# Patient Record
Sex: Female | Born: 1960 | Race: White | Hispanic: No | Marital: Married | Smoking: Current every day smoker
Health system: Southern US, Community
[De-identification: ages and names within clinical notes are randomized; demographics above are authoritative.]

## PROBLEM LIST (undated history)

## (undated) DIAGNOSIS — E785 Hyperlipidemia, unspecified: Secondary | ICD-10-CM

## (undated) DIAGNOSIS — R011 Cardiac murmur, unspecified: Secondary | ICD-10-CM

## (undated) DIAGNOSIS — I10 Essential (primary) hypertension: Secondary | ICD-10-CM

## (undated) HISTORY — DX: Cardiac murmur, unspecified: R01.1

## (undated) HISTORY — DX: Hyperlipidemia, unspecified: E78.5

## (undated) HISTORY — PX: TUBAL LIGATION: SHX77

## (undated) HISTORY — DX: Essential (primary) hypertension: I10

---

## 1997-12-25 ENCOUNTER — Ambulatory Visit (HOSPITAL_COMMUNITY): Admission: RE | Admit: 1997-12-25 | Discharge: 1997-12-25 | Payer: Self-pay | Admitting: Obstetrics and Gynecology

## 1998-09-06 ENCOUNTER — Other Ambulatory Visit: Admission: RE | Admit: 1998-09-06 | Discharge: 1998-09-06 | Payer: Self-pay | Admitting: *Deleted

## 1999-09-19 ENCOUNTER — Other Ambulatory Visit: Admission: RE | Admit: 1999-09-19 | Discharge: 1999-09-19 | Payer: Self-pay | Admitting: Obstetrics and Gynecology

## 2000-10-23 ENCOUNTER — Other Ambulatory Visit: Admission: RE | Admit: 2000-10-23 | Discharge: 2000-10-23 | Payer: Self-pay | Admitting: Obstetrics and Gynecology

## 2001-11-28 ENCOUNTER — Other Ambulatory Visit: Admission: RE | Admit: 2001-11-28 | Discharge: 2001-11-28 | Payer: Self-pay | Admitting: Obstetrics and Gynecology

## 2003-05-28 ENCOUNTER — Other Ambulatory Visit: Admission: RE | Admit: 2003-05-28 | Discharge: 2003-05-28 | Payer: Self-pay | Admitting: Obstetrics and Gynecology

## 2014-06-19 ENCOUNTER — Telehealth: Payer: Self-pay | Admitting: Neurology

## 2014-06-19 NOTE — Telephone Encounter (Signed)
Called back and documented in patient's mother's chart. This is not our patient.

## 2014-06-19 NOTE — Telephone Encounter (Signed)
Pt states that she needs to talk with you Deanna Woods please call [phone]

## 2014-06-19 NOTE — Telephone Encounter (Signed)
Pt is going to send something by my chart please check it

## 2014-11-06 ENCOUNTER — Encounter (HOSPITAL_COMMUNITY): Payer: Self-pay

## 2014-11-06 ENCOUNTER — Emergency Department (INDEPENDENT_AMBULATORY_CARE_PROVIDER_SITE_OTHER): Payer: Worker's Compensation

## 2014-11-06 ENCOUNTER — Emergency Department (INDEPENDENT_AMBULATORY_CARE_PROVIDER_SITE_OTHER)
Admission: EM | Admit: 2014-11-06 | Discharge: 2014-11-06 | Disposition: A | Discharge status: Home / Self Care | Payer: Worker's Compensation | Source: Home / Self Care | Attending: Emergency Medicine | Admitting: Emergency Medicine

## 2014-11-06 DIAGNOSIS — S6010XA Contusion of unspecified finger with damage to nail, initial encounter: Secondary | ICD-10-CM

## 2014-11-06 DIAGNOSIS — S6000XA Contusion of unspecified finger without damage to nail, initial encounter: Secondary | ICD-10-CM

## 2014-11-06 NOTE — ED Provider Notes (Signed)
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 There is pain and swelling over the distal phalanx. She has blood underneath the nail. It hurts to flex the interphalangeal joint.  Review of Systems   Other than as noted above, the patient denies any of the following symptoms: Systemic:  No fevers or chills. Musculoskeletal:  No joint pain or arthritis.  Neurological:  No muscular weakness or paresthesias.  March ARB   Past medical history, family history, social history, meds, and allergies were reviewed.     Physical Examination   Vital signs:  BP 154/88 mmHg  Pulse 90  Temp(Src) 98.7 F (37.1 C) (Oral)  Resp 18  SpO2 98% Gen:  Alert and in no distress. Musculoskeletal:  Exam of the hand reveals a subungual hematoma, and tenderness to palpation with swelling over the distal phalanx. The interphalangeal joint has a full range of motion but with pain.  Otherwise, all joints had a full a ROM with no swelling, bruising or deformity.  No edema, pulses full. Extremities were warm and pink.  Capillary refill was brisk.  Skin:  Clear, warm and dry.  No rash. Neuro:  Alert and oriented.  Muscle strength was normal.  Sensation was intact to light touch.   Radiology   Dg Finger Thumb Left  11/06/2014   CLINICAL DATA:  Slammed vehicle door on left thumb at work yesterday  EXAM: LEFT THUMB 2+V  COMPARISON:  None.  FINDINGS: Negative for fracture, dislocation or radiopaque foreign body. 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  IMPRESSION: Negative for acute fracture   Electronically Signed   By: Andreas Newport M.D.   On: 11/06/2014 18:38    I reviewed the images independently and personally and concur with the  radiologist's findings.  Procedure Note:  Verbal informed consent was obtained from the patient.  Risks and benefits were outlined with the patient.  Patient understands and accepts these risks. A time out was called and the name of the procedure, the procedure site, and identity of the patient were confirmed verbally and by wristband.    The procedure was then performed as follows:  The nail was prepped with alcohol, then 2 holes were made into the area of the subungual hematoma and large amount of blood was drained out. The patient experienced relief of pain thereafter. The thumb was bandaged.  The patient tolerated the procedure well without any immediate complications.    Assessment   The encounter diagnosis was Subungual hematoma of digit of hand, initial encounter.  Plan  1.  Meds:  The following meds were prescribed:  There are no discharge medications for this patient.   2.  Patient Education/Counseling:  The patient was given appropriate handouts, self care instructions, and instructed in symptomatic relief, including rest and activity, and elevation. She was instructed in wound care.  3.  Follow up:  The patient was told to follow up here if no better in 3 to 4 days, or sooner if becoming worse in any way, and given some red flag symptoms such as worsening pain, fever, swelling, or neurological symptoms which would prompt immediate return.  Return again as needed.      Harden Mo, MD 11/06/14  2047 

## 2014-11-06 NOTE — Discharge Instructions (Signed)
Wash with soap and water and apply antibiotic ointment.  Keep covered for 3 to 4 days.  Continue antibiotic ointment for 1 week.

## 2014-11-06 NOTE — ED Notes (Signed)
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 Distal segment swollen, nail bed ecchymotic. No new break in skin

## 2015-09-06 IMAGING — DX DG FINGER THUMB 2+V*L*
3 series · 3 of 3 positions shown · non-contrast
Comparison: None.

CLINICAL DATA: Slammed vehicle door on left thumb at work yesterday

EXAM:
LEFT THUMB 2+V

[finger ap]
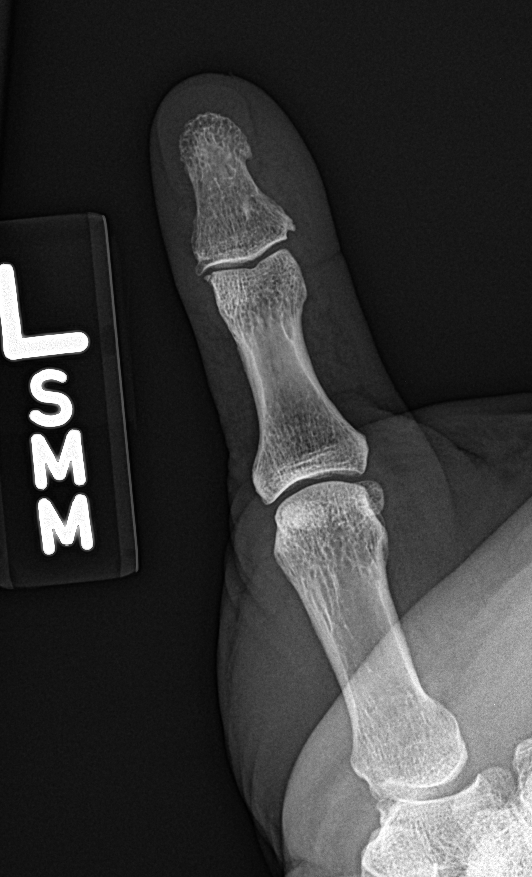

[finger obl]
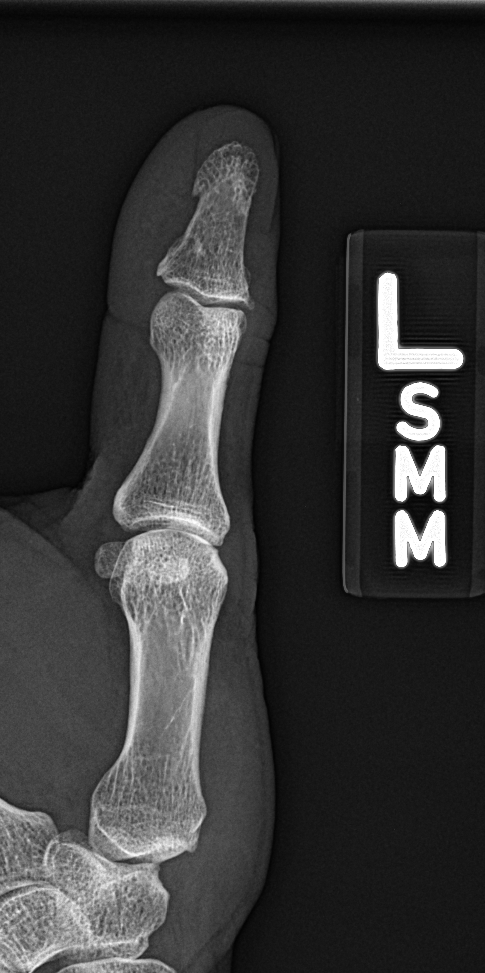

[finger lat]
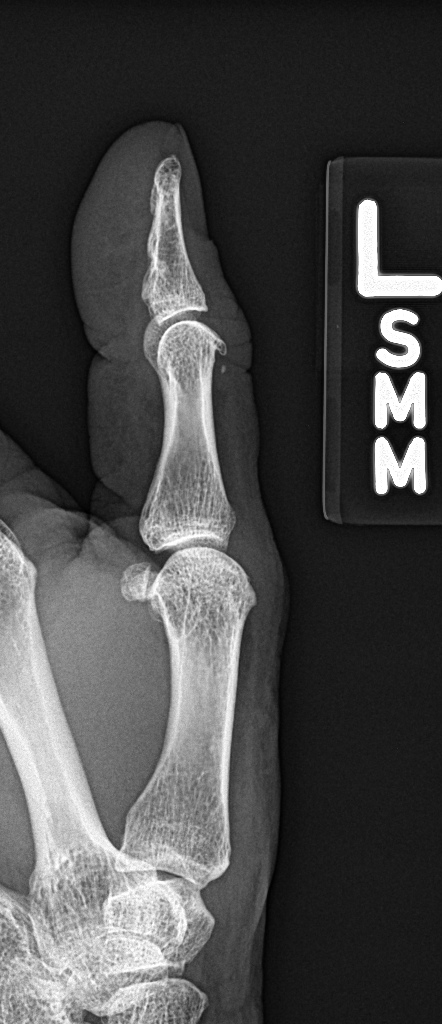

[3 of 3 positions shown; findings below may reference images not displayed]

FINDINGS: Negative for fracture, dislocation or radiopaque foreign body. There
is mild old fragmentation at the radial aspect of the
interphalangeal joint, and there are mild osteoarthritic changes of
the interphalangeal joint.
IMPRESSION: Negative for acute fracture

## 2015-10-26 ENCOUNTER — Encounter: Payer: Self-pay | Admitting: Internal Medicine

## 2015-10-26 ENCOUNTER — Encounter (INDEPENDENT_AMBULATORY_CARE_PROVIDER_SITE_OTHER): Payer: Self-pay

## 2015-10-26 ENCOUNTER — Ambulatory Visit (INDEPENDENT_AMBULATORY_CARE_PROVIDER_SITE_OTHER): Payer: BC Managed Care – PPO | Admitting: Internal Medicine

## 2015-10-26 VITALS — BP 156/88 | HR 86 | Temp 98.3°F | Resp 16 | Ht 67.0 in | Wt 182.0 lb

## 2015-10-26 DIAGNOSIS — Z23 Encounter for immunization: Secondary | ICD-10-CM | POA: Diagnosis not present

## 2015-10-26 DIAGNOSIS — Z0000 Encounter for general adult medical examination without abnormal findings: Secondary | ICD-10-CM

## 2015-10-26 DIAGNOSIS — F17210 Nicotine dependence, cigarettes, uncomplicated: Secondary | ICD-10-CM | POA: Insufficient documentation

## 2015-10-26 DIAGNOSIS — R030 Elevated blood-pressure reading, without diagnosis of hypertension: Secondary | ICD-10-CM

## 2015-10-26 DIAGNOSIS — Z720 Tobacco use: Secondary | ICD-10-CM

## 2015-10-26 DIAGNOSIS — IMO0001 Reserved for inherently not codable concepts without codable children: Secondary | ICD-10-CM | POA: Insufficient documentation

## 2015-10-26 NOTE — Assessment & Plan Note (Signed)
Blood pressure elevated here today and may have hypertension, but we need to recheck for officially diagnosing her with hypertension Advised her to monitor her own Low-sodium diet Start exercising regularly Smoking cessation discussed Check blood work Follow-up in a few weeks for nurse visit to recheck blood pressure

## 2015-10-26 NOTE — Patient Instructions (Addendum)
[facility] [address] [place], [place] [address] the [address] from [place]  [place], [place]    Test(s) ordered today. Your results will be released to [place] (or called to you) after review, usually within 72hours after test completion. If any changes need to be made, you will be notified at that same time.  All other Health Maintenance issues reviewed.   All recommended immunizations and age-appropriate screenings are up-to-date.  Tetanus vaccine administered today.   Medications reviewed and updated.  No changes recommended at this time.  Please call and schedule a mammogram and bone density scan or discuss with your new gyn.   Please schedule followup in 2-3 weeks for BP recheck with nurse   Health Maintenance, Female Adopting a healthy lifestyle and getting preventive care can go a long way to promote health and wellness. Talk with your health care provider about what schedule of regular examinations is right for you. This is a good chance for you to check in with your provider about disease prevention and staying healthy. In between checkups, there are plenty of things you can do on your own. Experts have done a lot of research about which lifestyle changes and preventive measures are most likely to keep you healthy. Ask your health care provider for more information. WEIGHT AND DIET  Eat a healthy diet  Be sure to include plenty of vegetables, fruits, low-fat dairy products, and lean protein.  Do not eat a lot of foods high in solid fats, added sugars, or salt.  Get regular exercise. This is one of the most important things you can do for your health.  Most adults should exercise for at least 150 minutes each week. The exercise should increase your heart rate and make you sweat  (moderate-intensity exercise).  Most adults should also do strengthening exercises at least twice a week. This is in addition to the moderate-intensity exercise.  Maintain a healthy weight  Body mass index (BMI) is a measurement that can be used to identify possible weight problems. It estimates body fat based on height and weight. Your health care provider can help determine your BMI and help you achieve or maintain a healthy weight.  For females 48 years of age and older:   A BMI below 18.5 is considered underweight.  A BMI of 18.5 to 24.9 is normal.  A BMI of 25 to 29.9 is considered overweight.  A BMI of 30 and above is considered obese.  Watch levels of cholesterol and blood lipids  You should start having your blood tested for lipids and cholesterol at 54 years of age, then have this test every 5 years.  You may need to have your cholesterol levels checked more often if:  Your lipid or cholesterol levels are high.  You are older than 54 years of age.  You are at high risk for heart disease.  CANCER SCREENING   Lung Cancer  Lung cancer screening is recommended for adults 57-78 years old who are at high risk for lung cancer because of a history of smoking.  A yearly low-dose CT scan of the lungs is recommended for people who:  Currently smoke.  Have quit within the past 15 years.  Have at least a 30-pack-year history of smoking. A pack year is smoking an average of one pack of cigarettes a day for 1 year.  Yearly screening should continue until it has been 15 years since you quit.  Yearly screening should stop if you develop a health problem that would prevent you from having lung cancer treatment.  Breast Cancer  Practice breast self-awareness. This means understanding how your breasts normally appear and feel.  It also means doing regular breast self-exams. Let your health care provider know about any changes, no matter how small.  If you are in your 20s  or 30s, you should have a clinical breast exam (CBE) by a health care provider every 1-3 years as part of a regular health exam.  If you are 68 or older, have a CBE every year. Also consider having a breast X-ray (mammogram) every year.  If you have a family history of breast cancer, talk to your health care provider about genetic screening.  If you are at high risk for breast cancer, talk to your health care provider about having an MRI and a mammogram every year.  Breast cancer gene (BRCA) assessment is recommended for women who have family members with BRCA-related cancers. BRCA-related cancers include:  Breast.  Ovarian.  Tubal.  Peritoneal cancers.  Results of the assessment will determine the need for genetic counseling and BRCA1 and BRCA2 testing. Cervical Cancer Your health care provider may recommend that you be screened regularly for cancer of the pelvic organs (ovaries, uterus, and vagina). This screening involves a pelvic examination, including checking for microscopic changes to the surface of your cervix (Pap test). You may be encouraged to have this screening done every 3 years, beginning at age 37.  For women ages 10-65, health care providers may recommend pelvic exams and Pap testing every 3 years, or they may recommend the Pap and pelvic exam, combined with testing for human papilloma virus (HPV), every 5 years. Some types of HPV increase your risk of cervical cancer. Testing for HPV may also be done on women of any age with unclear Pap test results.  Other health care providers may not recommend any screening for nonpregnant women who are considered low risk for pelvic cancer and who do not have symptoms. Ask your health care provider if a screening pelvic exam is right for you.  If you have had past treatment for cervical cancer or a condition that could lead to cancer, you need Pap tests and screening for cancer for at least 20 years after your treatment. If Pap tests  have been discontinued, your risk factors (such as having a new sexual partner) need to be reassessed to determine if screening should resume. Some women have medical problems that increase the chance of getting cervical cancer. In these cases, your health care provider may recommend more frequent screening and Pap tests. Colorectal Cancer  This type of cancer can be detected and often prevented.  Routine colorectal cancer screening usually begins at 54 years of age and continues through 54 years of age.  Your health care provider may recommend screening at an earlier age if you have risk factors for colon cancer.  Your health care provider may also recommend using home test kits to check for hidden blood in the stool.  A small camera at the end of a tube can be used to examine your colon directly (sigmoidoscopy or colonoscopy). This is done to check for the earliest forms of colorectal cancer.  Routine screening usually begins at age 51.  Direct examination of the colon should be repeated every 5-10 years through 54 years of age. However, you  may need to be screened more often if early forms of precancerous polyps or small growths are found. Skin Cancer  Check your skin from head to toe regularly.  Tell your health care provider about any new moles or changes in moles, especially if there is a change in a mole's shape or color.  Also tell your health care provider if you have a mole that is larger than the size of a pencil eraser.  Always use sunscreen. Apply sunscreen liberally and repeatedly throughout the day.  Protect yourself by wearing long sleeves, pants, a wide-brimmed hat, and sunglasses whenever you are outside. HEART DISEASE, DIABETES, AND HIGH BLOOD PRESSURE   High blood pressure causes heart disease and increases the risk of stroke. High blood pressure is more likely to develop in:  People who have blood pressure in the high end of the normal range (130-139/85-89 mm  Hg).  People who are overweight or obese.  People who are African American.  If you are 25-25 years of age, have your blood pressure checked every 3-5 years. If you are 49 years of age or older, have your blood pressure checked every year. You should have your blood pressure measured twice--once when you are at a hospital or clinic, and once when you are not at a hospital or clinic. Record the average of the two measurements. To check your blood pressure when you are not at a hospital or clinic, you can use:  An automated blood pressure machine at a pharmacy.  A home blood pressure monitor.  If you are between 76 years and 77 years old, ask your health care provider if you should take aspirin to prevent strokes.  Have regular diabetes screenings. This involves taking a blood sample to check your fasting blood sugar level.  If you are at a normal weight and have a low risk for diabetes, have this test once every three years after 54 years of age.  If you are overweight and have a high risk for diabetes, consider being tested at a younger age or more often. PREVENTING INFECTION  Hepatitis B  If you have a higher risk for hepatitis B, you should be screened for this virus. You are considered at high risk for hepatitis B if:  You were born in a country where hepatitis B is common. Ask your health care provider which countries are considered high risk.  Your parents were born in a high-risk country, and you have not been immunized against hepatitis B (hepatitis B vaccine).  You have HIV or AIDS.  You use needles to inject street drugs.  You live with someone who has hepatitis B.  You have had sex with someone who has hepatitis B.  You get hemodialysis treatment.  You take certain medicines for conditions, including cancer, organ transplantation, and autoimmune conditions. Hepatitis C  Blood testing is recommended for:  Everyone born from [birth year] through 1964.  Anyone with known  risk factors for hepatitis C. Sexually transmitted infections (STIs)  You should be screened for sexually transmitted infections (STIs) including gonorrhea and chlamydia if:  You are sexually active and are younger than 54 years of age.  You are older than 54 years of age and your health care provider tells you that you are at risk for this type of infection.  Your sexual activity has changed since you were last screened and you are at an increased risk for chlamydia or gonorrhea. Ask your health care provider if you are at risk.  If you do not have HIV, but are at risk, it may be recommended that you take a prescription medicine daily to prevent HIV infection. This is called pre-exposure prophylaxis (PrEP). You are considered at risk if:  You are sexually active and do not regularly use condoms or know the HIV status of your partner(s).  You take drugs by injection.  You are sexually active with a partner who has HIV. Talk with your health care provider about whether you are at high risk of being infected with HIV. If you choose to begin PrEP, you should first be tested for HIV. You should then be tested every 3 months for as long as you are taking PrEP.  PREGNANCY   If you are premenopausal and you may become pregnant, ask your health care provider about preconception counseling.  If you may become pregnant, take 400 to 800 micrograms (mcg) of folic acid every day.  If you want to prevent pregnancy, talk to your health care provider about birth control (contraception). OSTEOPOROSIS AND MENOPAUSE   Osteoporosis is a disease in which the bones lose minerals and strength with aging. This can result in serious bone fractures. Your risk for osteoporosis can be identified using a bone density scan.  If you are 25 years of age or older, or if you are at risk for osteoporosis and fractures, ask your health care provider if you should be screened.  Ask your health care provider whether you  should take a calcium or vitamin D supplement to lower your risk for osteoporosis.  Menopause may have certain physical symptoms and risks.  Hormone replacement therapy may reduce some of these symptoms and risks. Talk to your health care provider about whether hormone replacement therapy is right for you.  HOME CARE INSTRUCTIONS   Schedule regular health, dental, and eye exams.  Stay current with your immunizations.   Do not use any tobacco products including cigarettes, chewing tobacco, or electronic cigarettes.  If you are pregnant, do not drink alcohol.  If you are breastfeeding, limit how much and how often you drink alcohol.  Limit alcohol intake to no more than 1 drink per day for nonpregnant women. One drink equals 12 ounces of beer, 5 ounces of wine, or 1 ounces of hard liquor.  Do not use street drugs.  Do not share needles.  Ask your health care provider for help if you need support or information about quitting drugs.  Tell your health care provider if you often feel depressed.  Tell your health care provider if you have ever been abused or do not feel safe at home.   This information is not intended to replace advice given to you by your health care provider. Make sure you discuss any questions you have with your health care provider.   Document Released: 04/10/2011 Document Revised: 10/16/2014 Document Reviewed: 08/27/2013 Elsevier Interactive Patient Education [employer].

## 2015-10-26 NOTE — Progress Notes (Signed)
Pre visit review using our clinic review tool, if applicable. No additional management support is needed unless otherwise documented below in the visit note. 

## 2015-10-26 NOTE — Assessment & Plan Note (Signed)
Stressed smoking cessation Needs to wait until her husband is ready to quit We will discuss again at her next visit

## 2015-10-26 NOTE — Progress Notes (Signed)
Subjective:    Patient ID: Deanna Woods, female    DOB: [birth year], 55 y.o.   MRN: [id number]  HPI She is here to establish with a new pcp.  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    Her BP is elevated here today. The last time was checked was a few years ago. She denies a known history of high blood pressure.  She is smoking daily. She knows she should quit and wants to quit, but states she cannot quit her husband is not quite. She is working on trying to convince him the need to quit.  She is currently not exercising regularly. Her goal today is to start to get up-to-date with everything she has not been keeping up to date with. She has no major concerns or questions.    Medications and allergies reviewed with patient and no updates were needed-she has not taking any medication or supplements.  Patient Active Problem List   Diagnosis Date Noted  . Elevated blood pressure 10/26/2015  . Tobacco abuse 10/26/2015    No current outpatient prescriptions on file prior to visit.   No current facility-administered medications on file prior to visit.    Past Medical History  Diagnosis Date  . Heart murmur     Past Surgical History  Procedure Laterality Date  . Tubal ligation      Social History   Social History  . Marital Status: Married    Spouse Name: N/A  . Number of Children: N/A  . Years of Education: N/A   Social History Main Topics  . Smoking status: Current Every Day Smoker -- 0.50 packs/day for 25 years  . Smokeless tobacco: Never Used  . Alcohol Use: Yes     Comment: weekends  . Drug Use: No  . Sexual Activity: Not Asked   Other Topics Concern  . None   Social History Narrative   Very active, not regular exercise    Family History  Problem Relation Age of Onset  . Hypertension Mother   . Stroke Mother   . Kidney disease Mother   . Stroke Maternal Grandmother     Review of  Systems  Constitutional: Positive for fatigue. Negative for fever, chills, appetite change and unexpected weight change.       Sleeps 6 hrs  HENT: Negative for hearing loss.   Eyes: Negative for visual disturbance.  Respiratory: Negative for cough, shortness of breath and wheezing.   Cardiovascular: Negative for chest pain and leg swelling. Palpitations: stress related.  Gastrointestinal: Negative for nausea, abdominal pain, diarrhea, constipation and blood in stool.       No GERD  Genitourinary: Negative for dysuria and hematuria.  Musculoskeletal: Negative for myalgias, back pain and arthralgias.       Stiffness  Skin: Negative for rash.       No changes in moles/freckles  Neurological: Positive for headaches (occasional). Negative for dizziness, weakness, light-headedness and numbness.  Psychiatric/Behavioral: Negative for dysphoric mood. The patient is not nervous/anxious.        Objective:   Filed Vitals:   10/26/15 1506  BP: 156/88  Pulse: 86  Temp: 98.3 F (36.8 C)  Resp: 16   Filed Weights   10/26/15 1506  Weight: 182 lb (82.555 kg)   Body mass index is 28.5 kg/(m^2).   Physical Exam Constitutional: She appears well-developed and well-nourished. No distress.  HENT:  Head: Normocephalic and atraumatic.  Right Ear: External ear normal. Normal ear canal and TM Left Ear: External ear normal.  Normal ear canal and TM Mouth/Throat: Oropharynx is clear and moist.  Normal bilateral ear canals and tympanic membranes  Eyes: Conjunctivae and EOM are normal.  Neck: Neck supple. No tracheal deviation present. No thyromegaly present.  No carotid bruit  Cardiovascular: Normal rate, regular rhythm and normal heart sounds.   No murmur heard.  No edema. Pulmonary/Chest: Effort normal and breath sounds normal. No respiratory distress. She has no wheezes. She has no rales.  Breast: deferred to Gyn-we will make an appointment Abdominal: Soft. She exhibits no distension. There is  no tenderness.  Lymphadenopathy: She has no cervical adenopathy.  Skin: Skin is warm and dry. She is not diaphoretic.  Psychiatric: She has a normal mood and affect. Her behavior is normal.         Assessment & Plan:   Physical exam: Screening blood work ordered Immunizations - ? td today, flu - she will get at the pharmacy Colonoscopy - will refer  Mammogram - will schedule Gyn - given numbers - she will schedule Dexa - will schedule or discuss with gyn Eye exams - up to date, normal EKG today  Exercise - stressed regular exercise Weight - stressed regular exercise Skin  - no concerns Substance abuse - smoking - stressed cessation - she is not ready. We'll discuss again at her next visit   See problem list for assessment and plan of chronic medical conditions

## 2015-10-27 NOTE — Addendum Note (Signed)
Addended by: Terence Lux B on: 10/27/2015 08:23 AM   Modules accepted: Orders

## 2015-11-18 ENCOUNTER — Telehealth: Payer: Self-pay | Admitting: Emergency Medicine

## 2015-11-18 ENCOUNTER — Ambulatory Visit: Payer: BC Managed Care – PPO | Admitting: General Practice

## 2015-11-18 ENCOUNTER — Other Ambulatory Visit (INDEPENDENT_AMBULATORY_CARE_PROVIDER_SITE_OTHER): Payer: BC Managed Care – PPO

## 2015-11-18 VITALS — BP 140/90

## 2015-11-18 DIAGNOSIS — Z0000 Encounter for general adult medical examination without abnormal findings: Secondary | ICD-10-CM | POA: Diagnosis not present

## 2015-11-18 DIAGNOSIS — I10 Essential (primary) hypertension: Secondary | ICD-10-CM

## 2015-11-18 LAB — CBC WITH DIFFERENTIAL/PLATELET
BASOS ABS: 0.1 10*3/uL (ref 0.0–0.1)
Basophils Relative: 0.6 % (ref 0.0–3.0)
Eosinophils Absolute: 0.2 10*3/uL (ref 0.0–0.7)
Eosinophils Relative: 1.8 % (ref 0.0–5.0)
HEMATOCRIT: 45.1 % (ref 36.0–46.0)
Hemoglobin: 14.8 g/dL (ref 12.0–15.0)
LYMPHS PCT: 27.4 % (ref 12.0–46.0)
Lymphs Abs: 2.9 10*3/uL (ref 0.7–4.0)
MCHC: 32.7 g/dL (ref 30.0–36.0)
MCV: 90.5 fl (ref 78.0–100.0)
MONOS PCT: 5.6 % (ref 3.0–12.0)
Monocytes Absolute: 0.6 10*3/uL (ref 0.1–1.0)
NEUTROS ABS: 6.8 10*3/uL (ref 1.4–7.7)
NEUTROS PCT: 64.6 % (ref 43.0–77.0)
PLATELETS: 301 10*3/uL (ref 150.0–400.0)
RBC: 4.99 Mil/uL (ref 3.87–5.11)
RDW: 13.6 % (ref 11.5–15.5)
WBC: 10.5 10*3/uL (ref 4.0–10.5)

## 2015-11-18 LAB — COMPREHENSIVE METABOLIC PANEL
ALK PHOS: 73 U/L (ref 39–117)
ALT: 11 U/L (ref 0–35)
AST: 14 U/L (ref 0–37)
Albumin: 4.3 g/dL (ref 3.5–5.2)
BUN: 15 mg/dL (ref 6–23)
CALCIUM: 9.5 mg/dL (ref 8.4–10.5)
CO2: 30 meq/L (ref 19–32)
CREATININE: 0.88 mg/dL (ref 0.40–1.20)
Chloride: 105 mEq/L (ref 96–112)
GFR: 71.4 mL/min (ref >60.00)
GLUCOSE: 89 mg/dL (ref 70–99)
Potassium: 4 mEq/L (ref 3.5–5.1)
Sodium: 140 mEq/L (ref 135–145)
Total Bilirubin: 0.4 mg/dL (ref 0.2–1.2)
Total Protein: 7.1 g/dL (ref 6.0–8.3)

## 2015-11-18 LAB — LIPID PANEL
CHOL/HDL RATIO: 4
Cholesterol: 225 mg/dL — ABNORMAL HIGH (ref 0–200)
HDL: 60.4 mg/dL (ref >39.00)
LDL CALC: 133 mg/dL — AB (ref 0–99)
NonHDL: 164.25
Triglycerides: 156 mg/dL — ABNORMAL HIGH (ref 0.0–149.0)
VLDL: 31.2 mg/dL (ref 0.0–40.0)

## 2015-11-18 LAB — TSH: TSH: 2.47 u[IU]/mL (ref 0.35–4.50)

## 2015-11-18 LAB — HEMOGLOBIN A1C: Hgb A1c MFr Bld: 5.7 % (ref 4.6–6.5)

## 2015-11-18 LAB — HIV ANTIBODY (ROUTINE TESTING W REFLEX): HIV 1&2 Ab, 4th Generation: NONREACTIVE

## 2015-11-18 MED ORDER — LOSARTAN POTASSIUM 25 MG PO TABS: 25.0000 mg | ORAL_TABLET | Freq: Every day | ORAL | Status: DC

## 2015-11-18 NOTE — Telephone Encounter (Signed)
-----   Message from Binnie Rail, MD sent at 11/18/2015 10:22 AM EST ----- Regarding: FW: B/P re-check BP still high - I would recommend starting a low dose medication - see what she thinks. If start med - I would want to see her for follow up in 2-4 weeks.  Can try amlodipine 5 mg daily.    ----- Message -----    From: Warden Fillers, RN    Sent: 11/18/2015   8:58 AM      To: Binnie Rail, MD Subject: B/P re-check                                   See B/P reading.  Please advise!  Jenny Reichmann

## 2015-11-18 NOTE — Telephone Encounter (Signed)
LVM informing pt

## 2015-11-18 NOTE — Telephone Encounter (Signed)
Losartan 25 mg sent to pof - low dose and well tolerated -f/u with me in a few weeks.

## 2015-11-18 NOTE — Telephone Encounter (Signed)
Spoke with pt. She is okay with starting a lose dose med, but would like it to be something other than Amlodipine. She says that her mother did not have a good experience with this med. Please advise on another med that pt can try.

## 2015-11-22 LAB — VITAMIN D 1,25 DIHYDROXY
VITAMIN D 1, 25 (OH) TOTAL: 36 pg/mL (ref 18–72)
VITAMIN D3 1, 25 (OH): 36 pg/mL
Vitamin D2 1, 25 (OH)2: <8 pg/mL

## 2015-11-23 ENCOUNTER — Encounter: Payer: Self-pay | Admitting: Family

## 2015-11-27 ENCOUNTER — Encounter: Payer: Self-pay | Admitting: Internal Medicine

## 2016-03-13 ENCOUNTER — Other Ambulatory Visit: Payer: Self-pay | Admitting: Internal Medicine

## 2016-08-08 ENCOUNTER — Telehealth: Payer: Self-pay | Admitting: Internal Medicine

## 2016-08-08 DIAGNOSIS — Z1211 Encounter for screening for malignant neoplasm of colon: Secondary | ICD-10-CM

## 2016-08-08 NOTE — Telephone Encounter (Signed)
Patient is requesting a new referral to get a colonoscopy w/ GI

## 2016-08-09 NOTE — Telephone Encounter (Signed)
Please advise 

## 2016-08-09 NOTE — Telephone Encounter (Signed)
ordered

## 2016-08-09 NOTE — Telephone Encounter (Signed)
Tried calling pt, unable to LVM.

## 2016-08-14 ENCOUNTER — Encounter: Payer: Self-pay | Admitting: Internal Medicine

## 2016-09-08 ENCOUNTER — Other Ambulatory Visit: Payer: Self-pay | Admitting: Internal Medicine

## 2016-10-04 ENCOUNTER — Ambulatory Visit (AMBULATORY_SURGERY_CENTER): Payer: Self-pay | Admitting: *Deleted

## 2016-10-04 VITALS — Ht 67.0 in | Wt 190.0 lb

## 2016-10-04 DIAGNOSIS — Z1211 Encounter for screening for malignant neoplasm of colon: Secondary | ICD-10-CM

## 2016-10-04 MED ORDER — NA SULFATE-K SULFATE-MG SULF 17.5-3.13-1.6 GM/177ML PO SOLN: ORAL | 0 refills | Status: DC

## 2016-10-04 NOTE — Progress Notes (Signed)
Patient denies any allergies to eggs or soy. Patient denies any problems with anesthesia/sedation. Patient denies any oxygen use at home and does not take any diet/weight loss medications. EMMI education assisgned to patient on colonoscopy, this was explained and instructions given to patient. 

## 2016-10-09 HISTORY — PX: COLONOSCOPY: SHX174

## 2016-10-18 ENCOUNTER — Encounter: Payer: Self-pay | Admitting: Internal Medicine

## 2016-10-18 ENCOUNTER — Ambulatory Visit (AMBULATORY_SURGERY_CENTER): Payer: BC Managed Care – PPO | Admitting: Internal Medicine

## 2016-10-18 VITALS — BP 126/70 | HR 70 | Temp 97.5°F | Resp 19 | Ht 67.0 in | Wt 190.0 lb

## 2016-10-18 DIAGNOSIS — K635 Polyp of colon: Secondary | ICD-10-CM | POA: Diagnosis not present

## 2016-10-18 DIAGNOSIS — Z1211 Encounter for screening for malignant neoplasm of colon: Secondary | ICD-10-CM | POA: Diagnosis present

## 2016-10-18 DIAGNOSIS — Z1212 Encounter for screening for malignant neoplasm of rectum: Secondary | ICD-10-CM

## 2016-10-18 DIAGNOSIS — D122 Benign neoplasm of ascending colon: Secondary | ICD-10-CM

## 2016-10-18 MED ORDER — SODIUM CHLORIDE 0.9 % IV SOLN: 500.0000 mL | INTRAVENOUS | Status: DC

## 2016-10-18 NOTE — Progress Notes (Signed)
Spontaneous respirations throughout. VSS. Resting comfortably. To PACU on room air. Report to  Jane RN. 

## 2016-10-18 NOTE — Progress Notes (Signed)
Called to room to assist during endoscopic procedure.  Patient ID and intended procedure confirmed with present staff. Received instructions for my participation in the procedure from the performing physician.  

## 2016-10-18 NOTE — Op Note (Signed)
Deanna Woods: Deanna Woods Procedure Date: 10/18/2016 11:01 AM MRN: [id number] Endoscopist: Docia Chuck. Henrene Pastor , MD Age: 56 Referring MD:  Date of Birth: 27-Aug-1961 Gender: Female Account #: [id number] Procedure:                Colonoscopy, with cold snare polypectomy X 2 Indications:              Screening for colorectal malignant neoplasm Medicines:                Monitored Anesthesia Care Procedure:                Pre-Anesthesia Assessment:                           - Prior to the procedure, a History and Physical                            was performed, and patient medications and                            allergies were reviewed. The patient's tolerance of                            previous anesthesia was also reviewed. The risks                            and benefits of the procedure and the sedation                            options and risks were discussed with the patient.                            All questions were answered, and informed consent                            was obtained. Prior Anticoagulants: The patient has                            taken no previous anticoagulant or antiplatelet                            agents. ASA Grade Assessment: II - A patient with                            mild systemic disease. After reviewing the risks                            and benefits, the patient was deemed in                            satisfactory condition to undergo the procedure.                           After obtaining informed consent, the colonoscope  was passed under direct vision. Throughout the                            procedure, the patient's blood pressure, pulse, and                            oxygen saturations were monitored continuously. The                            Colonoscope was introduced through the anus and                            advanced to the the cecum, identified by                             appendiceal orifice and ileocecal valve. The                            ileocecal valve, appendiceal orifice, and rectum                            were photographed. The quality of the bowel                            preparation was excellent. The colonoscopy was                            performed without difficulty. The patient tolerated                            the procedure well. The bowel preparation used was                            SUPREP. Scope In: 11:08:21 AM Scope Out: 11:23:05 AM Scope Withdrawal Time: 0 hours 11 minutes 29 seconds  Total Procedure Duration: 0 hours 14 minutes 44 seconds  Findings:                 Two sessile polyps were found in the ascending                            colon. The polyps were 5 to 6 mm in size. These                            polyps were removed with a cold snare. Resection                            and retrieval were complete.                           Many diverticula were found in the sigmoid colon.                            There was an incidental 2 cm submucosal lipoma in  the ascending colon.                           Internal hemorrhoids were found during retroflexion.                           The exam was otherwise without abnormality on                            direct and retroflexion views. Complications:            No immediate complications. Estimated blood loss:                            None. Estimated Blood Loss:     Estimated blood loss: none. Impression:               - Two 5 to 6 mm polyps in the ascending colon,                            removed with a cold snare. Resected and retrieved.                           - Diverticulosis in the sigmoid colon. Incidental                            lipoma right colon.                           - Internal hemorrhoids.                           - The examination was otherwise normal on direct                            and retroflexion  views. Recommendation:           - Repeat colonoscopy in 5 years for surveillance.                           - Patient has a contact number available for                            emergencies. The signs and symptoms of potential                            delayed complications were discussed with the                            patient. Return to normal activities tomorrow.                            Written discharge instructions were provided to the                            patient.                           -  Resume previous diet.                           - Continue present medications.                           - Await pathology results. Docia Chuck. Henrene Pastor, MD 10/18/2016 11:28:34 AM This report has been signed electronically.

## 2016-10-18 NOTE — Patient Instructions (Signed)
Impression/Recommendations:  Polyp handout given to patient. Diverticulosis handout given to patient. Hemorrhoid handout given to patient.  Repeat colonoscopy In 5 years for surveillance.  Resume previous medications. Await pathology results.  YOU HAD AN ENDOSCOPIC PROCEDURE TODAY AT [facility]:   Refer to the procedure report that was given to you for any specific questions about what was found during the examination.  If the procedure report does not answer your questions, please call your gastroenterologist to clarify.  If you requested that your care partner not be given the details of your procedure findings, then the procedure report has been included in a sealed envelope for you to review at your convenience later.  YOU SHOULD EXPECT: Some feelings of bloating in the abdomen. Passage of more gas than usual.  Walking can help get rid of the air that was put into your GI tract during the procedure and reduce the bloating. If you had a lower endoscopy (such as a colonoscopy or flexible sigmoidoscopy) you may notice spotting of blood in your stool or on the toilet paper. If you underwent a bowel prep for your procedure, you may not have a normal bowel movement for a few days.  Please Note:  You might notice some irritation and congestion in your nose or some drainage.  This is from the oxygen used during your procedure.  There is no need for concern and it should clear up in a day or so.  SYMPTOMS TO REPORT IMMEDIATELY:   Following lower endoscopy (colonoscopy or flexible sigmoidoscopy):  Excessive amounts of blood in the stool  Significant tenderness or worsening of abdominal pains  Swelling of the abdomen that is new, acute  Fever of 100F or higher   For urgent or emergent issues, a gastroenterologist can be reached at any hour by calling [phone].   DIET:  We do recommend a small meal at first, but then you may proceed to your regular diet.  Drink plenty  of fluids but you should avoid alcoholic beverages for 24 hours.  ACTIVITY:  You should plan to take it easy for the rest of today and you should NOT DRIVE or use heavy machinery until tomorrow (because of the sedation medicines used during the test).    FOLLOW UP: Our staff will call the number listed on your records the next business day following your procedure to check on you and address any questions or concerns that you may have regarding the information given to you following your procedure. If we do not reach you, we will leave a message.  However, if you are feeling well and you are not experiencing any problems, there is no need to return our call.  We will assume that you have returned to your regular daily activities without incident.  If any biopsies were taken you will be contacted by phone or by letter within the next 1-3 weeks.  Please call us at [phone] if you have not heard about the biopsies in 3 weeks.    SIGNATURES/CONFIDENTIALITY: You and/or your care partner have signed paperwork which will be entered into your electronic medical record.  These signatures attest to the fact that that the information above on your After Visit Summary has been reviewed and is understood.  Full responsibility of the confidentiality of this discharge information lies with you and/or your care-partner.

## 2016-10-19 ENCOUNTER — Telehealth: Payer: Self-pay

## 2016-10-19 NOTE — Telephone Encounter (Signed)
  Follow up Call-  Call back number 10/18/2016  Post procedure Call Back phone  # [phone]  Permission to leave phone message Yes  Some recent data might be hidden    Patient was called for follow up after her procedure on 10/18/2016. No answer at the number given for follow up phone call. A message was left on the answering machine.

## 2016-10-19 NOTE — Telephone Encounter (Signed)
  Follow up Call-  Call back number 10/18/2016  Post procedure Call Back phone  # [phone]  Permission to leave phone message Yes  Some recent data might be hidden    Patient was called for follow up after her procedure on 10/18/2016. No answer at the number given for follow up phone call. A message was left on the answering machine.

## 2016-10-23 ENCOUNTER — Encounter: Payer: Self-pay | Admitting: Internal Medicine

## 2016-11-13 ENCOUNTER — Other Ambulatory Visit: Payer: Self-pay | Admitting: Internal Medicine

## 2016-11-19 ENCOUNTER — Other Ambulatory Visit: Payer: Self-pay | Admitting: Internal Medicine

## 2016-12-18 ENCOUNTER — Other Ambulatory Visit: Payer: Self-pay | Admitting: Internal Medicine

## 2016-12-22 ENCOUNTER — Other Ambulatory Visit: Payer: Self-pay | Admitting: Internal Medicine

## 2017-01-07 DIAGNOSIS — I10 Essential (primary) hypertension: Secondary | ICD-10-CM | POA: Insufficient documentation

## 2017-01-07 DIAGNOSIS — R7303 Prediabetes: Secondary | ICD-10-CM | POA: Insufficient documentation

## 2017-01-07 NOTE — Progress Notes (Signed)
Subjective:    Patient ID: Deanna Woods, female    DOB: 16-Jan-1961, 56 y.o.   MRN: [id number]  HPI She is here for a physical exam.   She took her BP last night and it was well controlled, 120/72.  She has increased stress and she has muscle pain in her right upper back.  She has used a Production assistant, radio, ice and exercise helps.    She is working quitting smoking.  She is using the patch. She is using the quit coach calls.    Medications and allergies reviewed with patient and updated if appropriate.  Patient Active Problem List   Diagnosis Date Noted  . Hypertension 01/07/2017  . Prediabetes 01/07/2017  . Tobacco abuse 10/26/2015    Current Outpatient Prescriptions on File Prior to Visit  Medication Sig Dispense Refill  . losartan (COZAAR) 25 MG tablet Take 1 tablet (25 mg total) by mouth daily. 30 tablet 0   No current facility-administered medications on file prior to visit.     Past Medical History:  Diagnosis Date  . Heart murmur   . Hypertension     Past Surgical History:  Procedure Laterality Date  . TUBAL LIGATION      Social History   Social History  . Marital status: Married    Spouse name: N/A  . Number of children: N/A  . Years of education: N/A   Social History Main Topics  . Smoking status: Current Every Day Smoker    Packs/day: 0.50    Years: 25.00  . Smokeless tobacco: Never Used  . Alcohol use Yes     Comment: weekends  . Drug use: No  . Sexual activity: Not on file   Other Topics Concern  . Not on file   Social History Narrative   Very active, not regular exercise    Family History  Problem Relation Age of Onset  . Hypertension Mother   . Stroke Mother   . Kidney disease Mother   . Stroke Maternal Grandmother   . Colon cancer Neg Hx     Review of Systems  Constitutional: Negative for chills and fever.  HENT: Negative for hearing loss.   Eyes: Negative for visual disturbance.  Respiratory: Negative for cough, shortness of  breath and wheezing.   Cardiovascular: Positive for palpitations (with anxiety only). Negative for chest pain and leg swelling.  Gastrointestinal: Negative for abdominal pain, blood in stool, constipation, diarrhea and nausea.       No gerd  Genitourinary: Negative for dysuria and hematuria.  Musculoskeletal: Positive for myalgias (muscle tightness in right upper back). Negative for arthralgias and back pain.  Skin: Negative for color change and rash.  Neurological: Negative for light-headedness and headaches.  Psychiatric/Behavioral: Negative for dysphoric mood. The patient is not nervous/anxious.        Objective:   Vitals:   01/08/17 1002  BP: (!) 146/80  Pulse: 78  Resp: 16  Temp: 97.9 F (36.6 C)   Filed Weights   01/08/17 1002  Weight: 185 lb (83.9 kg)   Body mass index is 28.98 kg/m.  Wt Readings from Last 3 Encounters:  01/08/17 185 lb (83.9 kg)  10/18/16 190 lb (86.2 kg)  10/04/16 190 lb (86.2 kg)     Physical Exam Constitutional: She appears well-developed and well-nourished. No distress.  HENT:  Head: Normocephalic and atraumatic.  Right Ear: External ear normal. Normal ear canal and TM Left Ear: External ear normal.  Normal ear canal and  TM Mouth/Throat: Oropharynx is clear and moist.  Eyes: Conjunctivae and EOM are normal.  Neck: Neck supple. No tracheal deviation present. No thyromegaly present.  No carotid bruit  Cardiovascular: Normal rate, regular rhythm and normal heart sounds.   No murmur heard.  No edema. Pulmonary/Chest: Effort normal and breath sounds normal. No respiratory distress. She has no wheezes. She has no rales.  Breast: deferred to Gyn Abdominal: Soft. She exhibits no distension. There is no tenderness.  Lymphadenopathy: She has no cervical adenopathy.  Skin: Skin is warm and dry. She is not diaphoretic.  Psychiatric: She has a normal mood and affect. Her behavior is normal.       Assessment & Plan:   Physical exam: Screening  blood work ordered Immunizations   Up to date  Colonoscopy   Up to date  Mammogram  Due - will schedule Gyn - due - will schedule Eye exams  Up to date  EKG  Done last 2017 Exercise - stressed regular exercise Weight - work on weight loss Skin uses sunscreen, will see derm Substance abuse  - quitting smoking with patches,  none  See Problem List for Assessment and Plan of chronic medical problems.

## 2017-01-08 ENCOUNTER — Ambulatory Visit (INDEPENDENT_AMBULATORY_CARE_PROVIDER_SITE_OTHER): Payer: BC Managed Care – PPO | Admitting: Internal Medicine

## 2017-01-08 ENCOUNTER — Encounter: Payer: Self-pay | Admitting: Internal Medicine

## 2017-01-08 ENCOUNTER — Other Ambulatory Visit (INDEPENDENT_AMBULATORY_CARE_PROVIDER_SITE_OTHER): Payer: BC Managed Care – PPO

## 2017-01-08 VITALS — BP 146/80 | HR 78 | Temp 97.9°F | Resp 16 | Wt 185.0 lb

## 2017-01-08 DIAGNOSIS — R7303 Prediabetes: Secondary | ICD-10-CM

## 2017-01-08 DIAGNOSIS — Z0000 Encounter for general adult medical examination without abnormal findings: Secondary | ICD-10-CM

## 2017-01-08 DIAGNOSIS — I10 Essential (primary) hypertension: Secondary | ICD-10-CM | POA: Diagnosis not present

## 2017-01-08 DIAGNOSIS — Z1159 Encounter for screening for other viral diseases: Secondary | ICD-10-CM

## 2017-01-08 DIAGNOSIS — E785 Hyperlipidemia, unspecified: Secondary | ICD-10-CM | POA: Insufficient documentation

## 2017-01-08 DIAGNOSIS — Z720 Tobacco use: Secondary | ICD-10-CM

## 2017-01-08 LAB — COMPREHENSIVE METABOLIC PANEL
ALBUMIN: 4.3 g/dL (ref 3.5–5.2)
ALK PHOS: 70 U/L (ref 39–117)
ALT: 11 U/L (ref 0–35)
AST: 14 U/L (ref 0–37)
BILIRUBIN TOTAL: 0.4 mg/dL (ref 0.2–1.2)
BUN: 13 mg/dL (ref 6–23)
CALCIUM: 9.4 mg/dL (ref 8.4–10.5)
CO2: 27 mEq/L (ref 19–32)
CREATININE: 0.73 mg/dL (ref 0.40–1.20)
Chloride: 106 mEq/L (ref 96–112)
GFR: 88.2 mL/min (ref >60.00)
Glucose, Bld: 95 mg/dL (ref 70–99)
Potassium: 3.9 mEq/L (ref 3.5–5.1)
SODIUM: 138 meq/L (ref 135–145)
TOTAL PROTEIN: 7.1 g/dL (ref 6.0–8.3)

## 2017-01-08 LAB — CBC WITH DIFFERENTIAL/PLATELET
BASOS PCT: 0.5 % (ref 0.0–3.0)
Basophils Absolute: 0 10*3/uL (ref 0.0–0.1)
Eosinophils Absolute: 0.1 10*3/uL (ref 0.0–0.7)
Eosinophils Relative: 1.2 % (ref 0.0–5.0)
HEMATOCRIT: 40.4 % (ref 36.0–46.0)
HEMOGLOBIN: 13.4 g/dL (ref 12.0–15.0)
LYMPHS PCT: 26.9 % (ref 12.0–46.0)
Lymphs Abs: 2.6 10*3/uL (ref 0.7–4.0)
MCHC: 33.1 g/dL (ref 30.0–36.0)
MCV: 90.1 fl (ref 78.0–100.0)
Monocytes Absolute: 0.6 10*3/uL (ref 0.1–1.0)
Monocytes Relative: 6.4 % (ref 3.0–12.0)
Neutro Abs: 6.2 10*3/uL (ref 1.4–7.7)
Neutrophils Relative %: 65 % (ref 43.0–77.0)
Platelets: 329 10*3/uL (ref 150.0–400.0)
RBC: 4.48 Mil/uL (ref 3.87–5.11)
RDW: 12.9 % (ref 11.5–15.5)
WBC: 9.5 10*3/uL (ref 4.0–10.5)

## 2017-01-08 LAB — LIPID PANEL
CHOLESTEROL: 240 mg/dL — AB (ref 0–200)
HDL: 53.4 mg/dL (ref >39.00)
LDL Cholesterol: 158 mg/dL — ABNORMAL HIGH (ref 0–99)
NONHDL: 186.58
Total CHOL/HDL Ratio: 4
Triglycerides: 143 mg/dL (ref 0.0–149.0)
VLDL: 28.6 mg/dL (ref 0.0–40.0)

## 2017-01-08 LAB — TSH: TSH: 2.62 u[IU]/mL (ref 0.35–4.50)

## 2017-01-08 LAB — HEPATITIS C ANTIBODY: HCV AB: NEGATIVE

## 2017-01-08 LAB — HEMOGLOBIN A1C: HEMOGLOBIN A1C: 5.8 % (ref 4.6–6.5)

## 2017-01-08 NOTE — Progress Notes (Signed)
Pre visit review using our clinic review tool, if applicable. No additional management support is needed unless otherwise documented below in the visit note. 

## 2017-01-08 NOTE — Assessment & Plan Note (Signed)
Currently quitting - using patches and quit coach

## 2017-01-08 NOTE — Assessment & Plan Note (Signed)
Check a1c Low sugar / carb diet Stressed regular exercise, weight loss  

## 2017-01-08 NOTE — Assessment & Plan Note (Signed)
Well-controlled at home.  Continue current medication. 

## 2017-01-08 NOTE — Patient Instructions (Signed)
Test(s) ordered today. Your results will be released to [place] (or called to you) after review, usually within 72hours after test completion. If any changes need to be made, you will be notified at that same time.  All other Health Maintenance issues reviewed.   All recommended immunizations and age-appropriate screenings are up-to-date or discussed.  No immunizations administered today.   Medications reviewed and updated.   No changes recommended at this time.   Please followup in one year, sooner if needed   Health Maintenance, Female Adopting a healthy lifestyle and getting preventive care can go a long way to promote health and wellness. Talk with your health care provider about what schedule of regular examinations is right for you. This is a good chance for you to check in with your provider about disease prevention and staying healthy. In between checkups, there are plenty of things you can do on your own. Experts have done a lot of research about which lifestyle changes and preventive measures are most likely to keep you healthy. Ask your health care provider for more information. Weight and diet Eat a healthy diet  Be sure to include plenty of vegetables, fruits, low-fat dairy products, and lean protein.  Do not eat a lot of foods high in solid fats, added sugars, or salt.  Get regular exercise. This is one of the most important things you can do for your health.  Most adults should exercise for at least 150 minutes each week. The exercise should increase your heart rate and make you sweat (moderate-intensity exercise).  Most adults should also do strengthening exercises at least twice a week. This is in addition to the moderate-intensity exercise. Maintain a healthy weight  Body mass index (BMI) is a measurement that can be used to identify possible weight problems. It estimates body fat based on height and weight. Your health care provider can help determine your BMI and  help you achieve or maintain a healthy weight.  For females 45 years of age and older:  A BMI below 18.5 is considered underweight.  A BMI of 18.5 to 24.9 is normal.  A BMI of 25 to 29.9 is considered overweight.  A BMI of 30 and above is considered obese. Watch levels of cholesterol and blood lipids  You should start having your blood tested for lipids and cholesterol at 55 years of age, then have this test every 5 years.  You may need to have your cholesterol levels checked more often if:  Your lipid or cholesterol levels are high.  You are older than 55 years of age.  You are at high risk for heart disease. Cancer screening Lung Cancer  Lung cancer screening is recommended for adults 94-60 years old who are at high risk for lung cancer because of a history of smoking.  A yearly low-dose CT scan of the lungs is recommended for people who:  Currently smoke.  Have quit within the past 15 years.  Have at least a 30-pack-year history of smoking. A pack year is smoking an average of one pack of cigarettes a day for 1 year.  Yearly screening should continue until it has been 15 years since you quit.  Yearly screening should stop if you develop a health problem that would prevent you from having lung cancer treatment. Breast Cancer  Practice breast self-awareness. This means understanding how your breasts normally appear and feel.  It also means doing regular breast self-exams. Let your health care provider know about any changes,  no matter how small.  If you are in your 20s or 30s, you should have a clinical breast exam (CBE) by a health care provider every 1-3 years as part of a regular health exam.  If you are 49 or older, have a CBE every year. Also consider having a breast X-ray (mammogram) every year.  If you have a family history of breast cancer, talk to your health care provider about genetic screening.  If you are at high risk for breast cancer, talk to your  health care provider about having an MRI and a mammogram every year.  Breast cancer gene (BRCA) assessment is recommended for women who have family members with BRCA-related cancers. BRCA-related cancers include:  Breast.  Ovarian.  Tubal.  Peritoneal cancers.  Results of the assessment will determine the need for genetic counseling and BRCA1 and BRCA2 testing. Cervical Cancer  Your health care provider may recommend that you be screened regularly for cancer of the pelvic organs (ovaries, uterus, and vagina). This screening involves a pelvic examination, including checking for microscopic changes to the surface of your cervix (Pap test). You may be encouraged to have this screening done every 3 years, beginning at age 75.  For women ages 35-65, health care providers may recommend pelvic exams and Pap testing every 3 years, or they may recommend the Pap and pelvic exam, combined with testing for human papilloma virus (HPV), every 5 years. Some types of HPV increase your risk of cervical cancer. Testing for HPV may also be done on women of any age with unclear Pap test results.  Other health care providers may not recommend any screening for nonpregnant women who are considered low risk for pelvic cancer and who do not have symptoms. Ask your health care provider if a screening pelvic exam is right for you.  If you have had past treatment for cervical cancer or a condition that could lead to cancer, you need Pap tests and screening for cancer for at least 20 years after your treatment. If Pap tests have been discontinued, your risk factors (such as having a new sexual partner) need to be reassessed to determine if screening should resume. Some women have medical problems that increase the chance of getting cervical cancer. In these cases, your health care provider may recommend more frequent screening and Pap tests. Colorectal Cancer  This type of cancer can be detected and often  prevented.  Routine colorectal cancer screening usually begins at 55 years of age and continues through 55 years of age.  Your health care provider may recommend screening at an earlier age if you have risk factors for colon cancer.  Your health care provider may also recommend using home test kits to check for hidden blood in the stool.  A small camera at the end of a tube can be used to examine your colon directly (sigmoidoscopy or colonoscopy). This is done to check for the earliest forms of colorectal cancer.  Routine screening usually begins at age 47.  Direct examination of the colon should be repeated every 5-10 years through 55 years of age. However, you may need to be screened more often if early forms of precancerous polyps or small growths are found. Skin Cancer  Check your skin from head to toe regularly.  Tell your health care provider about any new moles or changes in moles, especially if there is a change in a mole's shape or color.  Also tell your health care provider if you have a  mole that is larger than the size of a pencil eraser.  Always use sunscreen. Apply sunscreen liberally and repeatedly throughout the day.  Protect yourself by wearing long sleeves, pants, a wide-brimmed hat, and sunglasses whenever you are outside. Heart disease, diabetes, and high blood pressure  High blood pressure causes heart disease and increases the risk of stroke. High blood pressure is more likely to develop in:  People who have blood pressure in the high end of the normal range (130-139/85-89 mm Hg).  People who are overweight or obese.  People who are African American.  If you are 74-34 years of age, have your blood pressure checked every 3-5 years. If you are 47 years of age or older, have your blood pressure checked every year. You should have your blood pressure measured twice-once when you are at a hospital or clinic, and once when you are not at a hospital or clinic. Record  the average of the two measurements. To check your blood pressure when you are not at a hospital or clinic, you can use:  An automated blood pressure machine at a pharmacy.  A home blood pressure monitor.  If you are between 30 years and 70 years old, ask your health care provider if you should take aspirin to prevent strokes.  Have regular diabetes screenings. This involves taking a blood sample to check your fasting blood sugar level.  If you are at a normal weight and have a low risk for diabetes, have this test once every three years after 55 years of age.  If you are overweight and have a high risk for diabetes, consider being tested at a younger age or more often. Preventing infection Hepatitis B  If you have a higher risk for hepatitis B, you should be screened for this virus. You are considered at high risk for hepatitis B if:  You were born in a country where hepatitis B is common. Ask your health care provider which countries are considered high risk.  Your parents were born in a high-risk country, and you have not been immunized against hepatitis B (hepatitis B vaccine).  You have HIV or AIDS.  You use needles to inject street drugs.  You live with someone who has hepatitis B.  You have had sex with someone who has hepatitis B.  You get hemodialysis treatment.  You take certain medicines for conditions, including cancer, organ transplantation, and autoimmune conditions. Hepatitis C  Blood testing is recommended for:  Everyone born from [birth year] through 1964.  Anyone with known risk factors for hepatitis C. Sexually transmitted infections (STIs)  You should be screened for sexually transmitted infections (STIs) including gonorrhea and chlamydia if:  You are sexually active and are younger than 55 years of age.  You are older than 55 years of age and your health care provider tells you that you are at risk for this type of infection.  Your sexual activity has  changed since you were last screened and you are at an increased risk for chlamydia or gonorrhea. Ask your health care provider if you are at risk.  If you do not have HIV, but are at risk, it may be recommended that you take a prescription medicine daily to prevent HIV infection. This is called pre-exposure prophylaxis (PrEP). You are considered at risk if:  You are sexually active and do not regularly use condoms or know the HIV status of your partner(s).  You take drugs by injection.  You are sexually active  with a partner who has HIV. Talk with your health care provider about whether you are at high risk of being infected with HIV. If you choose to begin PrEP, you should first be tested for HIV. You should then be tested every 3 months for as long as you are taking PrEP. Pregnancy  If you are premenopausal and you may become pregnant, ask your health care provider about preconception counseling.  If you may become pregnant, take 400 to 800 micrograms (mcg) of folic acid every day.  If you want to prevent pregnancy, talk to your health care provider about birth control (contraception). Osteoporosis and menopause  Osteoporosis is a disease in which the bones lose minerals and strength with aging. This can result in serious bone fractures. Your risk for osteoporosis can be identified using a bone density scan.  If you are 21 years of age or older, or if you are at risk for osteoporosis and fractures, ask your health care provider if you should be screened.  Ask your health care provider whether you should take a calcium or vitamin D supplement to lower your risk for osteoporosis.  Menopause may have certain physical symptoms and risks.  Hormone replacement therapy may reduce some of these symptoms and risks. Talk to your health care provider about whether hormone replacement therapy is right for you. Follow these instructions at home:  Schedule regular health, dental, and eye  exams.  Stay current with your immunizations.  Do not use any tobacco products including cigarettes, chewing tobacco, or electronic cigarettes.  If you are pregnant, do not drink alcohol.  If you are breastfeeding, limit how much and how often you drink alcohol.  Limit alcohol intake to no more than 1 drink per day for nonpregnant women. One drink equals 12 ounces of beer, 5 ounces of wine, or 1 ounces of hard liquor.  Do not use street drugs.  Do not share needles.  Ask your health care provider for help if you need support or information about quitting drugs.  Tell your health care provider if you often feel depressed.  Tell your health care provider if you have ever been abused or do not feel safe at home. This information is not intended to replace advice given to you by your health care provider. Make sure you discuss any questions you have with your health care provider. Document Released: 04/10/2011 Document Revised: 03/02/2016 Document Reviewed: 06/29/2015 Elsevier Interactive Patient Education  2017 [employer].

## 2017-01-22 ENCOUNTER — Other Ambulatory Visit: Payer: Self-pay | Admitting: Internal Medicine

## 2017-07-18 ENCOUNTER — Other Ambulatory Visit: Payer: Self-pay | Admitting: Emergency Medicine

## 2017-07-18 MED ORDER — LOSARTAN POTASSIUM 25 MG PO TABS: 25.0000 mg | ORAL_TABLET | Freq: Every day | ORAL | 1 refills | Status: DC

## 2017-08-27 LAB — HM PAP SMEAR: HM PAP: NEGATIVE

## 2018-01-18 ENCOUNTER — Other Ambulatory Visit: Payer: Self-pay | Admitting: Internal Medicine

## 2018-02-13 NOTE — Progress Notes (Signed)
Subjective:    Patient ID: Deanna Woods, female    DOB: 02-26-1961, 57 y.o.   MRN: [id number]  HPI She is here for a physical exam.   She is working on quitting smoking.  She is using nicotine replacement.  This is helping.  She is about to decrease the dose of her patch and wonders if she should continue on her current dose longer.  She has b/l foot pain.  Now it is more the left foot.   She thinks it is plantar fasciitis.  Using a tennis ball in her arch helps.  She wear good shoes.  She does tend to go barefoot in the summer.  She does monitor her blood pressure at home and most of the time it is well controlled, but it is variable and she has had some high measures.  She is taking her blood pressure medication daily as prescribed.   Medications and allergies reviewed with patient and updated if appropriate.  Patient Active Problem List   Diagnosis Date Noted  . Hyperlipidemia 01/08/2017  . Hypertension 01/07/2017  . Prediabetes 01/07/2017  . Tobacco abuse 10/26/2015    Current Outpatient Medications on File Prior to Visit  Medication Sig Dispense Refill  . losartan (COZAAR) 25 MG tablet Take 1 tablet (25 mg total) by mouth daily. Need annual appointment for further refills 30 tablet 0   No current facility-administered medications on file prior to visit.     Past Medical History:  Diagnosis Date  . Heart murmur   . Hypertension     Past Surgical History:  Procedure Laterality Date  . TUBAL LIGATION      Social History   Socioeconomic History  . Marital status: Married    Spouse name: Not on file  . Number of children: Not on file  . Years of education: Not on file  . Highest education level: Not on file  Occupational History  . Not on file  Social Needs  . Financial resource strain: Not on file  . Food insecurity:    Worry: Not on file    Inability: Not on file  . Transportation needs:    Medical: Not on file    Non-medical: Not on file  Tobacco  Use  . Smoking status: Current Every Day Smoker    Packs/day: 0.50    Years: 25.00    Pack years: 12.50  . Smokeless tobacco: Never Used  Substance and Sexual Activity  . Alcohol use: Yes    Comment: weekends  . Drug use: No  . Sexual activity: Not on file  Lifestyle  . Physical activity:    Days per week: Not on file    Minutes per session: Not on file  . Stress: Not on file  Relationships  . Social connections:    Talks on phone: Not on file    Gets together: Not on file    Attends religious service: Not on file    Active member of club or organization: Not on file    Attends meetings of clubs or organizations: Not on file    Relationship status: Not on file  Other Topics Concern  . Not on file  Social History Narrative   Very active, not regular exercise    Family History  Problem Relation Age of Onset  . Hypertension Mother   . Stroke Mother   . Kidney disease Mother   . Stroke Maternal Grandmother   . Colon cancer Neg Hx  Review of Systems  Constitutional: Negative for chills and fever.  Eyes: Negative for visual disturbance.  Respiratory: Negative for cough, shortness of breath and wheezing.   Cardiovascular: Negative for chest pain, palpitations and leg swelling.  Gastrointestinal: Negative for abdominal pain, blood in stool, constipation, diarrhea and nausea.       No gerd  Genitourinary: Negative for dysuria and hematuria.  Musculoskeletal: Positive for arthralgias (hands, feet). Negative for back pain.  Skin: Negative for color change and rash.  Neurological: Positive for light-headedness (rare). Negative for headaches.  Psychiatric/Behavioral: Negative for dysphoric mood. The patient is not nervous/anxious.        Objective:   Vitals:   02/14/18 0944  BP: (!) 154/78  Pulse: 75  Resp: 16  Temp: 98.2 F (36.8 C)  SpO2: 98%   Filed Weights   02/14/18 0944  Weight: 185 lb (83.9 kg)   Body mass index is 28.98 kg/m.  Wt Readings from  Last 3 Encounters:  02/14/18 185 lb (83.9 kg)  01/08/17 185 lb (83.9 kg)  10/18/16 190 lb (86.2 kg)     Physical Exam Constitutional: She appears well-developed and well-nourished. No distress.  HENT:  Head: Normocephalic and atraumatic.  Right Ear: External ear normal. Normal ear canal and TM Left Ear: External ear normal.  Normal ear canal and TM Mouth/Throat: Oropharynx is clear and moist.  Eyes: Conjunctivae and EOM are normal.  Neck: Neck supple. No tracheal deviation present. No thyromegaly present.  No carotid bruit  Cardiovascular: Normal rate, regular rhythm and normal heart sounds.   2/6 systolic murmur heard.  No edema. Pulmonary/Chest: Effort normal and breath sounds normal. No respiratory distress. She has no wheezes. She has no rales.  Breast: deferred to Gyn Abdominal: Soft.  Mild, nontender umbilical hernia that is reducible.  She exhibits no distension. There is no tenderness.  Lymphadenopathy: She has no cervical adenopathy.  Skin: Skin is warm and dry. She is not diaphoretic.  Psychiatric: She has a normal mood and affect. Her behavior is normal.        Assessment & Plan:   Physical exam: Screening blood work  ordered Immunizations     up to date Colonoscopy    Up to date  Mammogram      Done at gyn Gyn  Up to date  Dexa  Has had one at gyn - repeat at 38 Eye exams   Up to date  EKG    Done 10/2015 Exercise  Irregular, but active - trying to exercise regularly Weight   Having difficulty losing weight - trying Skin     No concerns, saw derm Substance abuse  smoking  See Problem List for Assessment and Plan of chronic medical problems.    Follow-up in 6 months

## 2018-02-13 NOTE — Patient Instructions (Addendum)
Test(s) ordered today. Your results will be released to [facility] (or called to you) after review, usually within 72hours after test completion. If any changes need to be made, you will be notified at that same time.  All other Health Maintenance issues reviewed.   All recommended immunizations and age-appropriate screenings are up-to-date or discussed.  No immunizations administered today.   Medications reviewed and updated.  Changes include increasing the losartan to 50 mg daily.  Your prescription(s) have been submitted to your pharmacy. Please take as directed and contact our office if you believe you are having problem(s) with the medication(s).   Please followup in 6 months   Health Maintenance, Female Adopting a healthy lifestyle and getting preventive care can go a long way to promote health and wellness. Talk with your health care provider about what schedule of regular examinations is right for you. This is a good chance for you to check in with your provider about disease prevention and staying healthy. In between checkups, there are plenty of things you can do on your own. Experts have done a lot of research about which lifestyle changes and preventive measures are most likely to keep you healthy. Ask your health care provider for more information. Weight and diet Eat a healthy diet  Be sure to include plenty of vegetables, fruits, low-fat dairy products, and lean protein.  Do not eat a lot of foods high in solid fats, added sugars, or salt.  Get regular exercise. This is one of the most important things you can do for your health. ? Most adults should exercise for at least 150 minutes each week. The exercise should increase your heart rate and make you sweat (moderate-intensity exercise). ? Most adults should also do strengthening exercises at least twice a week. This is in addition to the moderate-intensity exercise.  Maintain a healthy weight  Body mass index (BMI) is a  measurement that can be used to identify possible weight problems. It estimates body fat based on height and weight. Your health care provider can help determine your BMI and help you achieve or maintain a healthy weight.  For females 81 years of age and older: ? A BMI below 18.5 is considered underweight. ? A BMI of 18.5 to 24.9 is normal. ? A BMI of 25 to 29.9 is considered overweight. ? A BMI of 30 and above is considered obese.  Watch levels of cholesterol and blood lipids  You should start having your blood tested for lipids and cholesterol at 56 years of age, then have this test every 5 years.  You may need to have your cholesterol levels checked more often if: ? Your lipid or cholesterol levels are high. ? You are older than 56 years of age. ? You are at high risk for heart disease.  Cancer screening Lung Cancer  Lung cancer screening is recommended for adults 56-66 years old who are at high risk for lung cancer because of a history of smoking.  A yearly low-dose CT scan of the lungs is recommended for people who: ? Currently smoke. ? Have quit within the past 15 years. ? Have at least a 30-pack-year history of smoking. A pack year is smoking an average of one pack of cigarettes a day for 1 year.  Yearly screening should continue until it has been 15 years since you quit.  Yearly screening should stop if you develop a health problem that would prevent you from having lung cancer treatment.  Breast Cancer  Practice breast self-awareness. This means understanding how your breasts normally appear and feel.  It also means doing regular breast self-exams. Let your health care provider know about any changes, no matter how small.  If you are in your 56s or 30s, you should have a clinical breast exam (CBE) by a health care provider every 1-3 years as part of a regular health exam.  If you are 56 or older, have a CBE every year. Also consider having a breast X-ray (mammogram)  every year.  If you have a family history of breast cancer, talk to your health care provider about genetic screening.  If you are at high risk for breast cancer, talk to your health care provider about having an MRI and a mammogram every year.  Breast cancer gene (BRCA) assessment is recommended for women who have family members with BRCA-related cancers. BRCA-related cancers include: ? Breast. ? Ovarian. ? Tubal. ? Peritoneal cancers.  Results of the assessment will determine the need for genetic counseling and BRCA1 and BRCA2 testing.  Cervical Cancer Your health care provider may recommend that you be screened regularly for cancer of the pelvic organs (ovaries, uterus, and vagina). This screening involves a pelvic examination, including checking for microscopic changes to the surface of your cervix (Pap test). You may be encouraged to have this screening done every 3 years, beginning at age 56.  For women ages 30-65, health care providers may recommend pelvic exams and Pap testing every 3 years, or they may recommend the Pap and pelvic exam, combined with testing for human papilloma virus (HPV), every 5 years. Some types of HPV increase your risk of cervical cancer. Testing for HPV may also be done on women of any age with unclear Pap test results.  Other health care providers may not recommend any screening for nonpregnant women who are considered low risk for pelvic cancer and who do not have symptoms. Ask your health care provider if a screening pelvic exam is right for you.  If you have had past treatment for cervical cancer or a condition that could lead to cancer, you need Pap tests and screening for cancer for at least 20 years after your treatment. If Pap tests have been discontinued, your risk factors (such as having a new sexual partner) need to be reassessed to determine if screening should resume. Some women have medical problems that increase the chance of getting cervical  cancer. In these cases, your health care provider may recommend more frequent screening and Pap tests.  Colorectal Cancer  This type of cancer can be detected and often prevented.  Routine colorectal cancer screening usually begins at 56 years of age and continues through 56 years of age.  Your health care provider may recommend screening at an earlier age if you have risk factors for colon cancer.  Your health care provider may also recommend using home test kits to check for hidden blood in the stool.  A small camera at the end of a tube can be used to examine your colon directly (sigmoidoscopy or colonoscopy). This is done to check for the earliest forms of colorectal cancer.  Routine screening usually begins at age 50.  Direct examination of the colon should be repeated every 5-10 years through 56 years of age. However, you may need to be screened more often if early forms of precancerous polyps or small growths are found.  Skin Cancer  Check your skin from head to toe regularly.  Tell your health care   provider about any new moles or changes in moles, especially if there is a change in a mole's shape or color.  Also tell your health care provider if you have a mole that is larger than the size of a pencil eraser.  Always use sunscreen. Apply sunscreen liberally and repeatedly throughout the day.  Protect yourself by wearing long sleeves, pants, a wide-brimmed hat, and sunglasses whenever you are outside.  Heart disease, diabetes, and high blood pressure  High blood pressure causes heart disease and increases the risk of stroke. High blood pressure is more likely to develop in: ? People who have blood pressure in the high end of the normal range (130-139/85-89 mm Hg). ? People who are overweight or obese. ? People who are African American.  If you are 18-39 years of age, have your blood pressure checked every 3-5 years. If you are 40 years of age or older, have your blood  pressure checked every year. You should have your blood pressure measured twice-once when you are at a hospital or clinic, and once when you are not at a hospital or clinic. Record the average of the two measurements. To check your blood pressure when you are not at a hospital or clinic, you can use: ? An automated blood pressure machine at a pharmacy. ? A home blood pressure monitor.  If you are between 55 years and 79 years old, ask your health care provider if you should take aspirin to prevent strokes.  Have regular diabetes screenings. This involves taking a blood sample to check your fasting blood sugar level. ? If you are at a normal weight and have a low risk for diabetes, have this test once every three years after 56 years of age. ? If you are overweight and have a high risk for diabetes, consider being tested at a younger age or more often. Preventing infection Hepatitis B  If you have a higher risk for hepatitis B, you should be screened for this virus. You are considered at high risk for hepatitis B if: ? You were born in a country where hepatitis B is common. Ask your health care provider which countries are considered high risk. ? Your parents were born in a high-risk country, and you have not been immunized against hepatitis B (hepatitis B vaccine). ? You have HIV or AIDS. ? You use needles to inject street drugs. ? You live with someone who has hepatitis B. ? You have had sex with someone who has hepatitis B. ? You get hemodialysis treatment. ? You take certain medicines for conditions, including cancer, organ transplantation, and autoimmune conditions.  Hepatitis C  Blood testing is recommended for: ? Everyone born from 1945 through 1965. ? Anyone with known risk factors for hepatitis C.  Sexually transmitted infections (STIs)  You should be screened for sexually transmitted infections (STIs) including gonorrhea and chlamydia if: ? You are sexually active and are  younger than 56 years of age. ? You are older than 56 years of age and your health care provider tells you that you are at risk for this type of infection. ? Your sexual activity has changed since you were last screened and you are at an increased risk for chlamydia or gonorrhea. Ask your health care provider if you are at risk.  If you do not have HIV, but are at risk, it may be recommended that you take a prescription medicine daily to prevent HIV infection. This is called pre-exposure prophylaxis (PrEP). You   are considered at risk if: ? You are sexually active and do not regularly use condoms or know the HIV status of your partner(s). ? You take drugs by injection. ? You are sexually active with a partner who has HIV.  Talk with your health care provider about whether you are at high risk of being infected with HIV. If you choose to begin PrEP, you should first be tested for HIV. You should then be tested every 3 months for as long as you are taking PrEP. Pregnancy  If you are premenopausal and you may become pregnant, ask your health care provider about preconception counseling.  If you may become pregnant, take 400 to 800 micrograms (mcg) of folic acid every day.  If you want to prevent pregnancy, talk to your health care provider about birth control (contraception). Osteoporosis and menopause  Osteoporosis is a disease in which the bones lose minerals and strength with aging. This can result in serious bone fractures. Your risk for osteoporosis can be identified using a bone density scan.  If you are 65 years of age or older, or if you are at risk for osteoporosis and fractures, ask your health care provider if you should be screened.  Ask your health care provider whether you should take a calcium or vitamin D supplement to lower your risk for osteoporosis.  Menopause may have certain physical symptoms and risks.  Hormone replacement therapy may reduce some of these symptoms and  risks. Talk to your health care provider about whether hormone replacement therapy is right for you. Follow these instructions at home:  Schedule regular health, dental, and eye exams.  Stay current with your immunizations.  Do not use any tobacco products including cigarettes, chewing tobacco, or electronic cigarettes.  If you are pregnant, do not drink alcohol.  If you are breastfeeding, limit how much and how often you drink alcohol.  Limit alcohol intake to no more than 1 drink per day for nonpregnant women. One drink equals 12 ounces of beer, 5 ounces of wine, or 1 ounces of hard liquor.  Do not use street drugs.  Do not share needles.  Ask your health care provider for help if you need support or information about quitting drugs.  Tell your health care provider if you often feel depressed.  Tell your health care provider if you have ever been abused or do not feel safe at home. This information is not intended to replace advice given to you by your health care provider. Make sure you discuss any questions you have with your health care provider. Document Released: 04/10/2011 Document Revised: 03/02/2016 Document Reviewed: 06/29/2015 Elsevier Interactive Patient Education  2018 [employer]  

## 2018-02-14 ENCOUNTER — Ambulatory Visit (INDEPENDENT_AMBULATORY_CARE_PROVIDER_SITE_OTHER): Payer: BC Managed Care – PPO | Admitting: Internal Medicine

## 2018-02-14 ENCOUNTER — Encounter: Payer: Self-pay | Admitting: Internal Medicine

## 2018-02-14 ENCOUNTER — Other Ambulatory Visit (INDEPENDENT_AMBULATORY_CARE_PROVIDER_SITE_OTHER): Payer: BC Managed Care – PPO

## 2018-02-14 VITALS — BP 154/78 | HR 75 | Temp 98.2°F | Resp 16 | Ht 67.0 in | Wt 185.0 lb

## 2018-02-14 DIAGNOSIS — C4491 Basal cell carcinoma of skin, unspecified: Secondary | ICD-10-CM | POA: Insufficient documentation

## 2018-02-14 DIAGNOSIS — R7303 Prediabetes: Secondary | ICD-10-CM

## 2018-02-14 DIAGNOSIS — I10 Essential (primary) hypertension: Secondary | ICD-10-CM

## 2018-02-14 DIAGNOSIS — C4441 Basal cell carcinoma of skin of scalp and neck: Secondary | ICD-10-CM

## 2018-02-14 DIAGNOSIS — E782 Mixed hyperlipidemia: Secondary | ICD-10-CM

## 2018-02-14 DIAGNOSIS — Z0001 Encounter for general adult medical examination with abnormal findings: Secondary | ICD-10-CM | POA: Diagnosis not present

## 2018-02-14 DIAGNOSIS — Z720 Tobacco use: Secondary | ICD-10-CM

## 2018-02-14 LAB — CBC WITH DIFFERENTIAL/PLATELET
BASOS ABS: 0.1 10*3/uL (ref 0.0–0.1)
Basophils Relative: 0.6 % (ref 0.0–3.0)
Eosinophils Absolute: 0.2 10*3/uL (ref 0.0–0.7)
Eosinophils Relative: 2.1 % (ref 0.0–5.0)
HCT: 39.6 % (ref 36.0–46.0)
Hemoglobin: 13.3 g/dL (ref 12.0–15.0)
Lymphocytes Relative: 29.9 % (ref 12.0–46.0)
Lymphs Abs: 2.7 10*3/uL (ref 0.7–4.0)
MCHC: 33.6 g/dL (ref 30.0–36.0)
MCV: 89.1 fl (ref 78.0–100.0)
MONO ABS: 0.6 10*3/uL (ref 0.1–1.0)
Monocytes Relative: 6.7 % (ref 3.0–12.0)
NEUTROS ABS: 5.4 10*3/uL (ref 1.4–7.7)
NEUTROS PCT: 60.7 % (ref 43.0–77.0)
PLATELETS: 293 10*3/uL (ref 150.0–400.0)
RBC: 4.44 Mil/uL (ref 3.87–5.11)
RDW: 12.9 % (ref 11.5–15.5)
WBC: 8.9 10*3/uL (ref 4.0–10.5)

## 2018-02-14 LAB — COMPREHENSIVE METABOLIC PANEL
ALBUMIN: 4.5 g/dL (ref 3.5–5.2)
ALK PHOS: 68 U/L (ref 39–117)
ALT: 9 U/L (ref 0–35)
AST: 15 U/L (ref 0–37)
BUN: 11 mg/dL (ref 6–23)
CALCIUM: 9.6 mg/dL (ref 8.4–10.5)
CHLORIDE: 103 meq/L (ref 96–112)
CO2: 29 mEq/L (ref 19–32)
CREATININE: 0.78 mg/dL (ref 0.40–1.20)
GFR: 81.38 mL/min (ref >60.00)
Glucose, Bld: 92 mg/dL (ref 70–99)
POTASSIUM: 3.9 meq/L (ref 3.5–5.1)
Sodium: 139 mEq/L (ref 135–145)
Total Bilirubin: 0.4 mg/dL (ref 0.2–1.2)
Total Protein: 7.3 g/dL (ref 6.0–8.3)

## 2018-02-14 LAB — TSH: TSH: 2.14 u[IU]/mL (ref 0.35–4.50)

## 2018-02-14 LAB — LIPID PANEL
CHOLESTEROL: 216 mg/dL — AB (ref 0–200)
HDL: 50.2 mg/dL (ref >39.00)
LDL CALC: 134 mg/dL — AB (ref 0–99)
NonHDL: 165.98
TRIGLYCERIDES: 161 mg/dL — AB (ref 0.0–149.0)
Total CHOL/HDL Ratio: 4
VLDL: 32.2 mg/dL (ref 0.0–40.0)

## 2018-02-14 LAB — HEMOGLOBIN A1C: HEMOGLOBIN A1C: 5.8 % (ref 4.6–6.5)

## 2018-02-14 MED ORDER — LOSARTAN POTASSIUM 50 MG PO TABS: 50.0000 mg | ORAL_TABLET | Freq: Every day | ORAL | 3 refills | Status: DC

## 2018-02-14 MED ORDER — NICOTINE STEP 2 14 MG/24HR TD PT24: MEDICATED_PATCH | TRANSDERMAL | 1 refills | Status: DC

## 2018-02-14 NOTE — Assessment & Plan Note (Addendum)
Has cut down on sugars/carb a1c today Increase exercise Trying to work on weight loss

## 2018-02-14 NOTE — Assessment & Plan Note (Signed)
Sees derm annually 

## 2018-02-14 NOTE — Assessment & Plan Note (Signed)
Check lipid panel  Continue daily statin Regular exercise and healthy diet encouraged  

## 2018-02-14 NOTE — Assessment & Plan Note (Addendum)
Working on quitting Using nicotine replacement-continue 14 mg patch for at least 2 more weeks Using other nicotine replacement as well

## 2018-02-14 NOTE — Assessment & Plan Note (Signed)
BP elevated here today BP at home - 139/71, 120/68, 120/64, 153/83, 134/89, 158/85, 114/68 Variable Low sodium diet  Continue exercise Work on eight loss Increase losartan 50 mg daily

## 2018-02-19 ENCOUNTER — Telehealth: Payer: Self-pay | Admitting: Internal Medicine

## 2018-02-19 NOTE — Telephone Encounter (Signed)
ROI faxed to [facility]

## 2018-04-13 ENCOUNTER — Encounter: Payer: Self-pay | Admitting: Internal Medicine

## 2018-04-13 ENCOUNTER — Other Ambulatory Visit: Payer: Self-pay | Admitting: Internal Medicine

## 2018-06-17 ENCOUNTER — Other Ambulatory Visit: Payer: Self-pay | Admitting: Internal Medicine

## 2018-08-19 ENCOUNTER — Ambulatory Visit: Payer: Self-pay | Admitting: Internal Medicine

## 2018-08-20 ENCOUNTER — Encounter: Payer: Self-pay | Admitting: Internal Medicine

## 2018-08-20 NOTE — Telephone Encounter (Signed)
Documented flu shot.../LMB  

## 2018-08-21 NOTE — Progress Notes (Signed)
Subjective:    Patient ID: Deanna Woods, female    DOB: [birth year], 57 y.o.   MRN: [id number]  HPI The patient is here for follow up.  Hypertension: She is taking her medication daily. She is compliant with a low sodium diet.  She denies chest pain, palpitations, edema, shortness of breath and regular headaches. She is exercising regularly.  She does monitor her blood pressure at home - well controlled at home.    Prediabetes:  She is compliant with a low sugar/carbohydrate diet.  She is exercising regularly -  Walking.  She has lost ten pounds.      Tobacco abuse:  She is quitting smoking.  She is using a e-cigarette with nicotine only when needed.  There are several days when she will not smoke at all.    Hyperlipidemia: She is not on any medication. She is compliant with a low fat/cholesterol diet. She is exercising regularly - walking after work.      Medications and allergies reviewed with patient and updated if appropriate.  Patient Active Problem List   Diagnosis Date Noted  . Basal cell carcinoma (BCC) 02/14/2018  . Hyperlipidemia 01/08/2017  . Hypertension 01/07/2017  . Prediabetes 01/07/2017  . Tobacco abuse 10/26/2015    Current Outpatient Medications on File Prior to Visit  Medication Sig Dispense Refill  . losartan (COZAAR) 50 MG tablet Take 1 tablet (50 mg total) by mouth daily. 90 tablet 3  . nicotine (NICODERM CQ - DOSED IN MG/24 HOURS) 14 mg/24hr patch APPLY PATCH TO THE UPPER BODY OR ARM EVRY 24 HOURS FOR 2 WEEKS. FOLLOW WITH STEPPED THERAPY 28 patch 1   No current facility-administered medications on file prior to visit.     Past Medical History:  Diagnosis Date  . Heart murmur   . Hypertension     Past Surgical History:  Procedure Laterality Date  . TUBAL LIGATION      Social History   Socioeconomic History  . Marital status: Married    Spouse name: Not on file  . Number of children: Not on file  . Years of education: Not on file  .  Highest education level: Not on file  Occupational History  . Not on file  Social Needs  . Financial resource strain: Not on file  . Food insecurity:    Worry: Not on file    Inability: Not on file  . Transportation needs:    Medical: Not on file    Non-medical: Not on file  Tobacco Use  . Smoking status: Current Every Day Smoker    Packs/day: 0.50    Years: 25.00    Pack years: 12.50  . Smokeless tobacco: Never Used  Substance and Sexual Activity  . Alcohol use: Yes    Comment: weekends  . Drug use: No  . Sexual activity: Not on file  Lifestyle  . Physical activity:    Days per week: Not on file    Minutes per session: Not on file  . Stress: Not on file  Relationships  . Social connections:    Talks on phone: Not on file    Gets together: Not on file    Attends religious service: Not on file    Active member of club or organization: Not on file    Attends meetings of clubs or organizations: Not on file    Relationship status: Not on file  Other Topics Concern  . Not on file  Social History Narrative  Very active, not regular exercise    Family History  Problem Relation Age of Onset  . Hypertension Mother   . Stroke Mother   . Kidney disease Mother   . Osteoporosis Mother   . Stroke Maternal Grandmother   . Colon cancer Neg Hx     Review of Systems  Constitutional: Negative for chills and fever.  Respiratory: Negative for cough, shortness of breath and wheezing.   Cardiovascular: Negative for chest pain, palpitations and leg swelling.  Neurological: Negative for light-headedness and headaches.       Objective:   Vitals:   08/22/18 0739  BP: (!) 154/70  Pulse: 67  Resp: 16  Temp: 98 F (36.7 C)  SpO2: 98%   BP Readings from Last 3 Encounters:  08/22/18 (!) 154/70  02/14/18 (!) 154/78  01/08/17 (!) 146/80   Wt Readings from Last 3 Encounters:  08/22/18 175 lb (79.4 kg)  02/14/18 185 lb (83.9 kg)  01/08/17 185 lb (83.9 kg)   Body mass  index is 27.41 kg/m.   Physical Exam    Constitutional: Appears well-developed and well-nourished. No distress.  HENT:  Head: Normocephalic and atraumatic.  Neck: Neck supple. No tracheal deviation present. No thyromegaly present.  No cervical lymphadenopathy Cardiovascular: Normal rate, regular rhythm and normal heart sounds.   No murmur heard. No carotid bruit .  No edema Pulmonary/Chest: Effort normal and breath sounds normal. No respiratory distress. No has no wheezes. No rales.  Skin: Skin is warm and dry. Not diaphoretic.  Psychiatric: Normal mood and affect. Behavior is normal.      Assessment & Plan:    See Problem List for Assessment and Plan of chronic medical problems.

## 2018-08-21 NOTE — Patient Instructions (Addendum)
  Tests ordered today. Your results will be released to MyChart (or called to you) after review, usually within 72hours after test completion. If any changes need to be made, you will be notified at that same time.  Medications reviewed and updated.  Changes include :   none      Please followup in 6 months   

## 2018-08-22 ENCOUNTER — Encounter: Payer: Self-pay | Admitting: Internal Medicine

## 2018-08-22 ENCOUNTER — Ambulatory Visit: Payer: BC Managed Care – PPO | Admitting: Internal Medicine

## 2018-08-22 ENCOUNTER — Other Ambulatory Visit (INDEPENDENT_AMBULATORY_CARE_PROVIDER_SITE_OTHER): Payer: BC Managed Care – PPO

## 2018-08-22 VITALS — BP 154/70 | HR 67 | Temp 98.0°F | Resp 16 | Ht 67.0 in | Wt 175.0 lb

## 2018-08-22 DIAGNOSIS — I10 Essential (primary) hypertension: Secondary | ICD-10-CM

## 2018-08-22 DIAGNOSIS — R7303 Prediabetes: Secondary | ICD-10-CM

## 2018-08-22 DIAGNOSIS — Z720 Tobacco use: Secondary | ICD-10-CM

## 2018-08-22 DIAGNOSIS — E782 Mixed hyperlipidemia: Secondary | ICD-10-CM

## 2018-08-22 LAB — COMPREHENSIVE METABOLIC PANEL
ALT: 14 U/L (ref 0–35)
AST: 18 U/L (ref 0–37)
Albumin: 4.6 g/dL (ref 3.5–5.2)
Alkaline Phosphatase: 67 U/L (ref 39–117)
BUN: 16 mg/dL (ref 6–23)
CHLORIDE: 105 meq/L (ref 96–112)
CO2: 27 mEq/L (ref 19–32)
Calcium: 9.8 mg/dL (ref 8.4–10.5)
Creatinine, Ser: 0.82 mg/dL (ref 0.40–1.20)
GFR: 76.67 mL/min (ref >60.00)
GLUCOSE: 100 mg/dL — AB (ref 70–99)
POTASSIUM: 4.2 meq/L (ref 3.5–5.1)
SODIUM: 140 meq/L (ref 135–145)
TOTAL PROTEIN: 7.3 g/dL (ref 6.0–8.3)
Total Bilirubin: 0.4 mg/dL (ref 0.2–1.2)

## 2018-08-22 LAB — LIPID PANEL
Cholesterol: 211 mg/dL — ABNORMAL HIGH (ref 0–200)
HDL: 50.3 mg/dL (ref >39.00)
LDL Cholesterol: 138 mg/dL — ABNORMAL HIGH (ref 0–99)
NONHDL: 160.57
Total CHOL/HDL Ratio: 4
Triglycerides: 114 mg/dL (ref 0.0–149.0)
VLDL: 22.8 mg/dL (ref 0.0–40.0)

## 2018-08-22 LAB — HEMOGLOBIN A1C: Hgb A1c MFr Bld: 5.6 % (ref 4.6–6.5)

## 2018-08-22 NOTE — Assessment & Plan Note (Signed)
BP well controlled at home - elevated here Advised her to bring in her cuff at her next visit Current regimen effective and well tolerated Continue current medications at current doses cmp

## 2018-08-22 NOTE — Assessment & Plan Note (Signed)
Actively quitting She uses the patches sometimes but will likely stop since they are no longer covered Occasional uses e-cig with nicotine only Has several days when she does not smoke at all Overall feels better Encouraged her to continue efforts

## 2018-08-22 NOTE — Assessment & Plan Note (Signed)
Check lipid panel  Not on medication Regular exercise and healthy diet encouraged, which she is doing Has lost 10 lbs since her last visit

## 2018-08-22 NOTE — Assessment & Plan Note (Signed)
Check a1c Low sugar / carb diet Stressed regular exercise   

## 2018-08-30 ENCOUNTER — Encounter: Payer: Self-pay | Admitting: Internal Medicine

## 2018-10-08 LAB — HM MAMMOGRAPHY

## 2018-11-12 ENCOUNTER — Other Ambulatory Visit: Payer: Self-pay | Admitting: Internal Medicine

## 2018-11-13 NOTE — Telephone Encounter (Signed)
I think the telmisartan was a replacement for losartan - is the patient ok with returning to losartan if available?

## 2018-11-14 MED ORDER — LOSARTAN POTASSIUM 25 MG PO TABS: ORAL_TABLET | ORAL | 0 refills | Status: DC

## 2018-11-14 MED ORDER — LOSARTAN POTASSIUM 25 MG PO TABS: 25.0000 mg | ORAL_TABLET | Freq: Every day | ORAL | 0 refills | Status: DC

## 2018-11-14 NOTE — Telephone Encounter (Signed)
LVM for pt to call back in regards.  

## 2018-11-14 NOTE — Telephone Encounter (Signed)
Pt is taking losartan 50 mg. Wants to stay on losartan. I sent in losartan 25 mg tablets to take 2 tablets once a day.

## 2019-02-10 ENCOUNTER — Other Ambulatory Visit: Payer: Self-pay | Admitting: Internal Medicine

## 2019-02-11 ENCOUNTER — Other Ambulatory Visit: Payer: Self-pay | Admitting: Internal Medicine

## 2019-02-11 NOTE — Telephone Encounter (Signed)
Can valsartan be sent in place of losartan.

## 2019-02-17 NOTE — Progress Notes (Signed)
Subjective:    Patient ID: Deanna Woods, female    DOB: Sep 23, 1961, 58 y.o.   MRN: [id number]  HPI She is here for a physical exam.   She is in the process of quitting smoking.  She did quit, but restarted with working t home.  She is now smoking again, but is actively quitting.  She only smokes one on occasion.    She is doing some exercise - walking and just started doing some yoga.    Medications and allergies reviewed with patient and updated if appropriate.  Patient Active Problem List   Diagnosis Date Noted  . Basal cell carcinoma (BCC) 02/14/2018  . Hyperlipidemia 01/08/2017  . Hypertension 01/07/2017  . Prediabetes 01/07/2017  . Tobacco abuse 10/26/2015    Current Outpatient Medications on File Prior to Visit  Medication Sig Dispense Refill  . nicotine (NICODERM CQ - DOSED IN MG/24 HOURS) 14 mg/24hr patch APPLY PATCH TO THE UPPER BODY OR ARM EVRY 24 HOURS FOR 2 WEEKS. FOLLOW WITH STEPPED THERAPY 28 patch 1  . valsartan (DIOVAN) 80 MG tablet Take 1 tablet (80 mg total) by mouth daily. Please specify directions, refills and quantity 30 tablet 5   No current facility-administered medications on file prior to visit.     Past Medical History:  Diagnosis Date  . Heart murmur   . Hypertension     Past Surgical History:  Procedure Laterality Date  . TUBAL LIGATION      Social History   Socioeconomic History  . Marital status: Married    Spouse name: Not on file  . Number of children: Not on file  . Years of education: Not on file  . Highest education level: Not on file  Occupational History  . Not on file  Social Needs  . Financial resource strain: Not on file  . Food insecurity:    Worry: Not on file    Inability: Not on file  . Transportation needs:    Medical: Not on file    Non-medical: Not on file  Tobacco Use  . Smoking status: Current Every Day Smoker    Packs/day: 0.50    Years: 25.00    Pack years: 12.50  . Smokeless tobacco: Never Used   Substance and Sexual Activity  . Alcohol use: Yes    Comment: weekends  . Drug use: No  . Sexual activity: Not on file  Lifestyle  . Physical activity:    Days per week: Not on file    Minutes per session: Not on file  . Stress: Not on file  Relationships  . Social connections:    Talks on phone: Not on file    Gets together: Not on file    Attends religious service: Not on file    Active member of club or organization: Not on file    Attends meetings of clubs or organizations: Not on file    Relationship status: Not on file  Other Topics Concern  . Not on file  Social History Narrative   Very active, not regular exercise    Family History  Problem Relation Age of Onset  . Hypertension Mother   . Stroke Mother   . Kidney disease Mother   . Osteoporosis Mother   . Stroke Maternal Grandmother   . Colon cancer Neg Hx     Review of Systems  Constitutional: Negative for chills and fever.  Eyes: Negative for visual disturbance.  Respiratory: Negative for cough, shortness of  breath and wheezing.   Cardiovascular: Negative for chest pain, palpitations and leg swelling.  Gastrointestinal: Negative for abdominal pain, blood in stool, constipation, diarrhea and nausea.       No gerd  Genitourinary: Negative for dysuria and hematuria.  Musculoskeletal: Positive for arthralgias (fingers). Negative for back pain.  Skin: Negative for color change and rash.  Neurological: Negative for light-headedness and headaches.  Psychiatric/Behavioral: Negative for dysphoric mood. The patient is not nervous/anxious.        Objective:   Vitals:   02/18/19 0736  BP: 136/78  Pulse: 80  Resp: 16  Temp: 98.7 F (37.1 C)  SpO2: 99%   Filed Weights   02/18/19 0736  Weight: 174 lb 8 oz (79.2 kg)   Body mass index is 27.33 kg/m.  BP Readings from Last 3 Encounters:  02/18/19 136/78  08/22/18 (!) 154/70  02/14/18 (!) 154/78    Wt Readings from Last 3 Encounters:  02/18/19 174 lb  8 oz (79.2 kg)  08/22/18 175 lb (79.4 kg)  02/14/18 185 lb (83.9 kg)     Physical Exam Constitutional: She appears well-developed and well-nourished. No distress.  HENT:  Head: Normocephalic and atraumatic.  Right Ear: External ear normal. Normal ear canal and TM Left Ear: External ear normal.  Normal ear canal and TM Mouth/Throat: Oropharynx is clear and moist.  Eyes: Conjunctivae and EOM are normal.  Neck: Neck supple. No tracheal deviation present. No thyromegaly present.  No carotid bruit  Cardiovascular: Normal rate, regular rhythm and normal heart sounds.   1/6 early systolic murmur heard.  No edema. Pulmonary/Chest: Effort normal and breath sounds normal. No respiratory distress. She has no wheezes. She has no rales.  Breast: deferred   Abdominal: Soft. She exhibits no distension. There is no tenderness.  Lymphadenopathy: She has no cervical adenopathy.  Skin: Skin is warm and dry. She is not diaphoretic.  Psychiatric: She has a normal mood and affect. Her behavior is normal.        Assessment & Plan:   Physical exam: Screening blood work ordered Immunizations  Discussed shingrix, others up to date Colonoscopy   Up to date  Mammogram   Up to date  Gyn   Up to date  Eye exams   Up to date  Exercise  Walking, some yoga Weight  Working on weight loss Skin no concerns  See derm annually Substance abuse  Working on quitting smoking - using nicotine patch  See Problem List for Assessment and Plan of chronic medical problems.   FU in 6 months

## 2019-02-17 NOTE — Patient Instructions (Addendum)
Tests ordered today. Your results will be released to MyChart (or called to you) after review, usually within 72hours after test completion. If any changes need to be made, you will be notified at that same time.  All other Health Maintenance issues reviewed.   All recommended immunizations and age-appropriate screenings are up-to-date or discussed.  No immunizations administered today.   Medications reviewed and updated.  Changes include :   none   Please followup in 6 months   Health Maintenance, Female Adopting a healthy lifestyle and getting preventive care can go a long way to promote health and wellness. Talk with your health care provider about what schedule of regular examinations is right for you. This is a good chance for you to check in with your provider about disease prevention and staying healthy. In between checkups, there are plenty of things you can do on your own. Experts have done a lot of research about which lifestyle changes and preventive measures are most likely to keep you healthy. Ask your health care provider for more information. Weight and diet Eat a healthy diet  Be sure to include plenty of vegetables, fruits, low-fat dairy products, and lean protein.  Do not eat a lot of foods high in solid fats, added sugars, or salt.  Get regular exercise. This is one of the most important things you can do for your health. ? Most adults should exercise for at least 150 minutes each week. The exercise should increase your heart rate and make you sweat (moderate-intensity exercise). ? Most adults should also do strengthening exercises at least twice a week. This is in addition to the moderate-intensity exercise. Maintain a healthy weight  Body mass index (BMI) is a measurement that can be used to identify possible weight problems. It estimates body fat based on height and weight. Your health care provider can help determine your BMI and help you achieve or maintain a  healthy weight.  For females 20 years of age and older: ? A BMI below 18.5 is considered underweight. ? A BMI of 18.5 to 24.9 is normal. ? A BMI of 25 to 29.9 is considered overweight. ? A BMI of 30 and above is considered obese. Watch levels of cholesterol and blood lipids  You should start having your blood tested for lipids and cholesterol at 57 years of age, then have this test every 5 years.  You may need to have your cholesterol levels checked more often if: ? Your lipid or cholesterol levels are high. ? You are older than 57 years of age. ? You are at high risk for heart disease. Cancer screening Lung Cancer  Lung cancer screening is recommended for adults 55-80 years old who are at high risk for lung cancer because of a history of smoking.  A yearly low-dose CT scan of the lungs is recommended for people who: ? Currently smoke. ? Have quit within the past 15 years. ? Have at least a 30-pack-year history of smoking. A pack year is smoking an average of one pack of cigarettes a day for 1 year.  Yearly screening should continue until it has been 15 years since you quit.  Yearly screening should stop if you develop a health problem that would prevent you from having lung cancer treatment. Breast Cancer  Practice breast self-awareness. This means understanding how your breasts normally appear and feel.  It also means doing regular breast self-exams. Let your health care provider know about any changes, no matter how small.    If you are in your 20s or 30s, you should have a clinical breast exam (CBE) by a health care provider every 1-3 years as part of a regular health exam.  If you are 40 or older, have a CBE every year. Also consider having a breast X-ray (mammogram) every year.  If you have a family history of breast cancer, talk to your health care provider about genetic screening.  If you are at high risk for breast cancer, talk to your health care provider about having  an MRI and a mammogram every year.  Breast cancer gene (BRCA) assessment is recommended for women who have family members with BRCA-related cancers. BRCA-related cancers include: ? Breast. ? Ovarian. ? Tubal. ? Peritoneal cancers.  Results of the assessment will determine the need for genetic counseling and BRCA1 and BRCA2 testing. Cervical Cancer Your health care provider may recommend that you be screened regularly for cancer of the pelvic organs (ovaries, uterus, and vagina). This screening involves a pelvic examination, including checking for microscopic changes to the surface of your cervix (Pap test). You may be encouraged to have this screening done every 3 years, beginning at age 21.  For women ages 30-65, health care providers may recommend pelvic exams and Pap testing every 3 years, or they may recommend the Pap and pelvic exam, combined with testing for human papilloma virus (HPV), every 5 years. Some types of HPV increase your risk of cervical cancer. Testing for HPV may also be done on women of any age with unclear Pap test results.  Other health care providers may not recommend any screening for nonpregnant women who are considered low risk for pelvic cancer and who do not have symptoms. Ask your health care provider if a screening pelvic exam is right for you.  If you have had past treatment for cervical cancer or a condition that could lead to cancer, you need Pap tests and screening for cancer for at least 20 years after your treatment. If Pap tests have been discontinued, your risk factors (such as having a new sexual partner) need to be reassessed to determine if screening should resume. Some women have medical problems that increase the chance of getting cervical cancer. In these cases, your health care provider may recommend more frequent screening and Pap tests. Colorectal Cancer  This type of cancer can be detected and often prevented.  Routine colorectal cancer screening  usually begins at 57 years of age and continues through 57 years of age.  Your health care provider may recommend screening at an earlier age if you have risk factors for colon cancer.  Your health care provider may also recommend using home test kits to check for hidden blood in the stool.  A small camera at the end of a tube can be used to examine your colon directly (sigmoidoscopy or colonoscopy). This is done to check for the earliest forms of colorectal cancer.  Routine screening usually begins at age 50.  Direct examination of the colon should be repeated every 5-10 years through 57 years of age. However, you may need to be screened more often if early forms of precancerous polyps or small growths are found. Skin Cancer  Check your skin from head to toe regularly.  Tell your health care provider about any new moles or changes in moles, especially if there is a change in a mole's shape or color.  Also tell your health care provider if you have a mole that is larger than the   size of a pencil eraser.  Always use sunscreen. Apply sunscreen liberally and repeatedly throughout the day.  Protect yourself by wearing long sleeves, pants, a wide-brimmed hat, and sunglasses whenever you are outside. Heart disease, diabetes, and high blood pressure  High blood pressure causes heart disease and increases the risk of stroke. High blood pressure is more likely to develop in: ? People who have blood pressure in the high end of the normal range (130-139/85-89 mm Hg). ? People who are overweight or obese. ? People who are African American.  If you are 18-39 years of age, have your blood pressure checked every 3-5 years. If you are 40 years of age or older, have your blood pressure checked every year. You should have your blood pressure measured twice-once when you are at a hospital or clinic, and once when you are not at a hospital or clinic. Record the average of the two measurements. To check your  blood pressure when you are not at a hospital or clinic, you can use: ? An automated blood pressure machine at a pharmacy. ? A home blood pressure monitor.  If you are between 55 years and 79 years old, ask your health care provider if you should take aspirin to prevent strokes.  Have regular diabetes screenings. This involves taking a blood sample to check your fasting blood sugar level. ? If you are at a normal weight and have a low risk for diabetes, have this test once every three years after 57 years of age. ? If you are overweight and have a high risk for diabetes, consider being tested at a younger age or more often. Preventing infection Hepatitis B  If you have a higher risk for hepatitis B, you should be screened for this virus. You are considered at high risk for hepatitis B if: ? You were born in a country where hepatitis B is common. Ask your health care provider which countries are considered high risk. ? Your parents were born in a high-risk country, and you have not been immunized against hepatitis B (hepatitis B vaccine). ? You have HIV or AIDS. ? You use needles to inject street drugs. ? You live with someone who has hepatitis B. ? You have had sex with someone who has hepatitis B. ? You get hemodialysis treatment. ? You take certain medicines for conditions, including cancer, organ transplantation, and autoimmune conditions. Hepatitis C  Blood testing is recommended for: ? Everyone born from 1945 through 1965. ? Anyone with known risk factors for hepatitis C. Sexually transmitted infections (STIs)  You should be screened for sexually transmitted infections (STIs) including gonorrhea and chlamydia if: ? You are sexually active and are younger than 57 years of age. ? You are older than 57 years of age and your health care provider tells you that you are at risk for this type of infection. ? Your sexual activity has changed since you were last screened and you are at an  increased risk for chlamydia or gonorrhea. Ask your health care provider if you are at risk.  If you do not have HIV, but are at risk, it may be recommended that you take a prescription medicine daily to prevent HIV infection. This is called pre-exposure prophylaxis (PrEP). You are considered at risk if: ? You are sexually active and do not regularly use condoms or know the HIV status of your partner(s). ? You take drugs by injection. ? You are sexually active with a partner who has HIV.   has HIV. Talk with your health care provider about whether you are at high risk of being infected with HIV. If you choose to begin PrEP, you should first be tested for HIV. You should then be tested every 3 months for as long as you are taking PrEP. Pregnancy  If you are premenopausal and you may become pregnant, ask your health care provider about preconception counseling.  If you may become pregnant, take 400 to 800 micrograms (mcg) of folic acid every day.  If you want to prevent pregnancy, talk to your health care provider about birth control (contraception). Osteoporosis and menopause  Osteoporosis is a disease in which the bones lose minerals and strength with aging. This can result in serious bone fractures. Your risk for osteoporosis can be identified using a bone density scan.  If you are 24 years of age or older, or if you are at risk for osteoporosis and fractures, ask your health care provider if you should be screened.  Ask your health care provider whether you should take a calcium or vitamin D supplement to lower your risk for osteoporosis.  Menopause may have certain physical symptoms and risks.  Hormone replacement therapy may reduce some of these symptoms and risks. Talk to your health care provider about whether hormone replacement therapy is right for you. Follow these instructions at home:  Schedule regular health, dental, and eye exams.  Stay current with your immunizations.  Do not use  any tobacco products including cigarettes, chewing tobacco, or electronic cigarettes.  If you are pregnant, do not drink alcohol.  If you are breastfeeding, limit how much and how often you drink alcohol.  Limit alcohol intake to no more than 1 drink per day for nonpregnant women. One drink equals 12 ounces of beer, 5 ounces of wine, or 1 ounces of hard liquor.  Do not use street drugs.  Do not share needles.  Ask your health care provider for help if you need support or information about quitting drugs.  Tell your health care provider if you often feel depressed.  Tell your health care provider if you have ever been abused or do not feel safe at home. This information is not intended to replace advice given to you by your health care provider. Make sure you discuss any questions you have with your health care provider. Document Released: 04/10/2011 Document Revised: 03/02/2016 Document Reviewed: 06/29/2015 Elsevier Interactive Patient Education  2019 [employer].

## 2019-02-18 ENCOUNTER — Encounter: Payer: Self-pay | Admitting: Internal Medicine

## 2019-02-18 ENCOUNTER — Other Ambulatory Visit (INDEPENDENT_AMBULATORY_CARE_PROVIDER_SITE_OTHER): Payer: BC Managed Care – PPO

## 2019-02-18 ENCOUNTER — Ambulatory Visit (INDEPENDENT_AMBULATORY_CARE_PROVIDER_SITE_OTHER): Payer: BC Managed Care – PPO | Admitting: Internal Medicine

## 2019-02-18 ENCOUNTER — Other Ambulatory Visit: Payer: Self-pay

## 2019-02-18 VITALS — BP 136/78 | HR 80 | Temp 98.7°F | Resp 16 | Ht 67.0 in | Wt 174.5 lb

## 2019-02-18 DIAGNOSIS — E782 Mixed hyperlipidemia: Secondary | ICD-10-CM

## 2019-02-18 DIAGNOSIS — R011 Cardiac murmur, unspecified: Secondary | ICD-10-CM

## 2019-02-18 DIAGNOSIS — C4441 Basal cell carcinoma of skin of scalp and neck: Secondary | ICD-10-CM

## 2019-02-18 DIAGNOSIS — I10 Essential (primary) hypertension: Secondary | ICD-10-CM

## 2019-02-18 DIAGNOSIS — R7303 Prediabetes: Secondary | ICD-10-CM

## 2019-02-18 DIAGNOSIS — Z0000 Encounter for general adult medical examination without abnormal findings: Secondary | ICD-10-CM | POA: Diagnosis not present

## 2019-02-18 DIAGNOSIS — Z720 Tobacco use: Secondary | ICD-10-CM

## 2019-02-18 DIAGNOSIS — Z0001 Encounter for general adult medical examination with abnormal findings: Secondary | ICD-10-CM

## 2019-02-18 LAB — CBC WITH DIFFERENTIAL/PLATELET
Basophils Absolute: 0.1 10*3/uL (ref 0.0–0.1)
Basophils Relative: 0.7 % (ref 0.0–3.0)
Eosinophils Absolute: 0.2 10*3/uL (ref 0.0–0.7)
Eosinophils Relative: 2.1 % (ref 0.0–5.0)
HCT: 40 % (ref 36.0–46.0)
Hemoglobin: 13.5 g/dL (ref 12.0–15.0)
Lymphocytes Relative: 31.8 % (ref 12.0–46.0)
Lymphs Abs: 2.6 10*3/uL (ref 0.7–4.0)
MCHC: 33.8 g/dL (ref 30.0–36.0)
MCV: 90.4 fl (ref 78.0–100.0)
Monocytes Absolute: 0.5 10*3/uL (ref 0.1–1.0)
Monocytes Relative: 6.2 % (ref 3.0–12.0)
Neutro Abs: 4.9 10*3/uL (ref 1.4–7.7)
Neutrophils Relative %: 59.2 % (ref 43.0–77.0)
Platelets: 321 10*3/uL (ref 150.0–400.0)
RBC: 4.43 Mil/uL (ref 3.87–5.11)
RDW: 13.1 % (ref 11.5–15.5)
WBC: 8.2 10*3/uL (ref 4.0–10.5)

## 2019-02-18 LAB — COMPREHENSIVE METABOLIC PANEL
ALT: 12 U/L (ref 0–35)
AST: 17 U/L (ref 0–37)
Albumin: 4.4 g/dL (ref 3.5–5.2)
Alkaline Phosphatase: 75 U/L (ref 39–117)
BUN: 15 mg/dL (ref 6–23)
CO2: 27 mEq/L (ref 19–32)
Calcium: 9.3 mg/dL (ref 8.4–10.5)
Chloride: 103 mEq/L (ref 96–112)
Creatinine, Ser: 0.81 mg/dL (ref 0.40–1.20)
GFR: 73.03 mL/min (ref >60.00)
Glucose, Bld: 90 mg/dL (ref 70–99)
Potassium: 4 mEq/L (ref 3.5–5.1)
Sodium: 139 mEq/L (ref 135–145)
Total Bilirubin: 0.4 mg/dL (ref 0.2–1.2)
Total Protein: 7.1 g/dL (ref 6.0–8.3)

## 2019-02-18 LAB — TSH: TSH: 2.76 u[IU]/mL (ref 0.35–4.50)

## 2019-02-18 LAB — LIPID PANEL
Cholesterol: 232 mg/dL — ABNORMAL HIGH (ref 0–200)
HDL: 50.8 mg/dL (ref >39.00)
LDL Cholesterol: 144 mg/dL — ABNORMAL HIGH (ref 0–99)
NonHDL: 180.71
Total CHOL/HDL Ratio: 5
Triglycerides: 186 mg/dL — ABNORMAL HIGH (ref 0.0–149.0)
VLDL: 37.2 mg/dL (ref 0.0–40.0)

## 2019-02-18 LAB — HEMOGLOBIN A1C: Hgb A1c MFr Bld: 5.7 % (ref 4.6–6.5)

## 2019-02-18 MED ORDER — CALCIUM 600+D3 PLUS MINERALS 600-800 MG-UNIT PO CHEW: 1.0000 | CHEWABLE_TABLET | Freq: Every day | ORAL | Status: AC

## 2019-02-18 NOTE — Assessment & Plan Note (Signed)
Sees dermatology regularly - visits up to date

## 2019-02-18 NOTE — Assessment & Plan Note (Signed)
Actively quitting Smokes on occasion Using nicotine replacement

## 2019-02-18 NOTE — Assessment & Plan Note (Signed)
Well controlled at home and BP good here BP well controlled Current regimen effective and well tolerated Continue current medications at current doses cmp

## 2019-02-18 NOTE — Assessment & Plan Note (Signed)
Check a1c Low sugar / carb diet Stressed regular exercise   

## 2019-02-18 NOTE — Assessment & Plan Note (Signed)
Check lipid panel, cmp, tsh Lifestyle controlled Regular exercise and healthy diet encouraged

## 2019-02-18 NOTE — Assessment & Plan Note (Signed)
Mild early systolic murmur - 1/6 Will monitor clinically

## 2019-06-04 ENCOUNTER — Encounter: Payer: Self-pay | Admitting: Internal Medicine

## 2019-08-01 ENCOUNTER — Other Ambulatory Visit: Payer: Self-pay | Admitting: Internal Medicine

## 2019-08-21 ENCOUNTER — Ambulatory Visit: Payer: BC Managed Care – PPO | Admitting: Internal Medicine

## 2019-09-01 NOTE — Patient Instructions (Addendum)
  Tests ordered today. Your results will be released to MyChart (or called to you) after review.  If any changes need to be made, you will be notified at that same time.    Medications reviewed and updated.  Changes include :   none     Please followup in 6 months   

## 2019-09-01 NOTE — Progress Notes (Signed)
Subjective:    Patient ID: Deanna Woods, female    DOB: 02-08-1961, 58 y.o.   MRN: [id number]  HPI The patient is here for follow up.  She is exercising regularly 5 week - walking 5 days .    Hypertension: She is taking her medication daily. She is compliant with a low sodium diet.  She denies chest pain, palpitations, edema, shortness of breath and regular headaches. She does monitor her blood pressure at home - 122/74, 116/?.    Prediabetes:  She is compliant with a low sugar/carbohydrate diet.  She is exercising regularly.  Hyperlipidemia: She is controlling her cholesterol with diet. She is compliant with a low fat/cholesterol diet.       Medications and allergies reviewed with patient and updated if appropriate.  Patient Active Problem List   Diagnosis Date Noted  . Murmur 02/18/2019  . Basal cell carcinoma (BCC) 02/14/2018  . Hyperlipidemia 01/08/2017  . Hypertension 01/07/2017  . Prediabetes 01/07/2017  . Tobacco abuse 10/26/2015    Current Outpatient Medications on File Prior to Visit  Medication Sig Dispense Refill  . Calcium Carbonate-Vit D-Min (CALCIUM 600+D3 PLUS MINERALS) 600-800 MG-UNIT CHEW Chew 1 each by mouth daily. 60 tablet   . nicotine (NICODERM CQ - DOSED IN MG/24 HOURS) 14 mg/24hr patch APPLY PATCH TO THE UPPER BODY OR ARM EVRY 24 HOURS FOR 2 WEEKS. FOLLOW WITH STEPPED THERAPY 28 patch 1  . valsartan (DIOVAN) 80 MG tablet TAKE 1 TABLET (80 MG TOTAL) BY MOUTH DAILY. 90 tablet 0   No current facility-administered medications on file prior to visit.     Past Medical History:  Diagnosis Date  . Heart murmur   . Hypertension     Past Surgical History:  Procedure Laterality Date  . TUBAL LIGATION      Social History   Socioeconomic History  . Marital status: Married    Spouse name: Not on file  . Number of children: Not on file  . Years of education: Not on file  . Highest education level: Not on file  Occupational History  . Not on file   Social Needs  . Financial resource strain: Not on file  . Food insecurity    Worry: Not on file    Inability: Not on file  . Transportation needs    Medical: Not on file    Non-medical: Not on file  Tobacco Use  . Smoking status: Current Every Day Smoker    Packs/day: 0.50    Years: 25.00    Pack years: 12.50  . Smokeless tobacco: Never Used  Substance and Sexual Activity  . Alcohol use: Yes    Comment: weekends  . Drug use: No  . Sexual activity: Not on file  Lifestyle  . Physical activity    Days per week: Not on file    Minutes per session: Not on file  . Stress: Not on file  Relationships  . Social Herbalist on phone: Not on file    Gets together: Not on file    Attends religious service: Not on file    Active member of club or organization: Not on file    Attends meetings of clubs or organizations: Not on file    Relationship status: Not on file  Other Topics Concern  . Not on file  Social History Narrative   Very active, not regular exercise    Family History  Problem Relation Age of Onset  .  Hypertension Mother   . Stroke Mother   . Kidney disease Mother   . Osteoporosis Mother   . Stroke Maternal Grandmother   . Colon cancer Neg Hx     Review of Systems  Constitutional: Negative for chills and fever.  Respiratory: Negative for cough and wheezing.   Cardiovascular: Negative for chest pain, palpitations and leg swelling.  Neurological: Positive for light-headedness (occ - gets up too fast). Negative for headaches.       Objective:   Vitals:   09/02/19 0742  BP: (!) 154/88  Pulse: 90  Resp: 16  Temp: 97.9 F (36.6 C)  SpO2: 99%   BP Readings from Last 3 Encounters:  09/02/19 (!) 154/88  02/18/19 136/78  08/22/18 (!) 154/70   Wt Readings from Last 3 Encounters:  09/02/19 168 lb 12.8 oz (76.6 kg)  02/18/19 174 lb 8 oz (79.2 kg)  08/22/18 175 lb (79.4 kg)   Body mass index is 26.44 kg/m.   Physical Exam    Constitutional:  Appears well-developed and well-nourished. No distress.  HENT:  Head: Normocephalic and atraumatic.  Neck: Neck supple. No tracheal deviation present. No thyromegaly present.  No cervical lymphadenopathy Cardiovascular: Normal rate, regular rhythm and normal heart sounds.   No murmur heard. No carotid bruit .  No edema Pulmonary/Chest: Effort normal and breath sounds normal. No respiratory distress. No has no wheezes. No rales.  Skin: Skin is warm and dry. Not diaphoretic.  Psychiatric: Normal mood and affect. Behavior is normal.      Assessment & Plan:    See Problem List for Assessment and Plan of chronic medical problems.

## 2019-09-02 ENCOUNTER — Ambulatory Visit (INDEPENDENT_AMBULATORY_CARE_PROVIDER_SITE_OTHER): Payer: BC Managed Care – PPO | Admitting: Internal Medicine

## 2019-09-02 ENCOUNTER — Encounter: Payer: Self-pay | Admitting: Internal Medicine

## 2019-09-02 ENCOUNTER — Other Ambulatory Visit (INDEPENDENT_AMBULATORY_CARE_PROVIDER_SITE_OTHER): Payer: BC Managed Care – PPO

## 2019-09-02 ENCOUNTER — Other Ambulatory Visit: Payer: Self-pay

## 2019-09-02 VITALS — BP 154/88 | HR 90 | Temp 97.9°F | Resp 16 | Ht 67.0 in | Wt 168.8 lb

## 2019-09-02 DIAGNOSIS — R7303 Prediabetes: Secondary | ICD-10-CM

## 2019-09-02 DIAGNOSIS — I10 Essential (primary) hypertension: Secondary | ICD-10-CM

## 2019-09-02 DIAGNOSIS — E782 Mixed hyperlipidemia: Secondary | ICD-10-CM

## 2019-09-02 LAB — COMPREHENSIVE METABOLIC PANEL
ALT: 16 U/L (ref 0–35)
AST: 21 U/L (ref 0–37)
Albumin: 4.2 g/dL (ref 3.5–5.2)
Alkaline Phosphatase: 67 U/L (ref 39–117)
BUN: 18 mg/dL (ref 6–23)
CO2: 27 mEq/L (ref 19–32)
Calcium: 9.6 mg/dL (ref 8.4–10.5)
Chloride: 104 mEq/L (ref 96–112)
Creatinine, Ser: 0.73 mg/dL (ref 0.40–1.20)
GFR: 82.19 mL/min (ref >60.00)
Glucose, Bld: 99 mg/dL (ref 70–99)
Potassium: 4 mEq/L (ref 3.5–5.1)
Sodium: 138 mEq/L (ref 135–145)
Total Bilirubin: 0.5 mg/dL (ref 0.2–1.2)
Total Protein: 7.3 g/dL (ref 6.0–8.3)

## 2019-09-02 LAB — LIPID PANEL
Cholesterol: 213 mg/dL — ABNORMAL HIGH (ref 0–200)
HDL: 54.4 mg/dL (ref >39.00)
LDL Cholesterol: 136 mg/dL — ABNORMAL HIGH (ref 0–99)
NonHDL: 158.33
Total CHOL/HDL Ratio: 4
Triglycerides: 112 mg/dL (ref 0.0–149.0)
VLDL: 22.4 mg/dL (ref 0.0–40.0)

## 2019-09-02 LAB — HEMOGLOBIN A1C: Hgb A1c MFr Bld: 5.7 % (ref 4.6–6.5)

## 2019-09-02 NOTE — Assessment & Plan Note (Signed)
BP well controlled at home, always elevated here Current regimen effective and well tolerated Continue current medications at current doses cmp

## 2019-09-02 NOTE — Assessment & Plan Note (Signed)
Check a1c Low sugar / carb diet Stressed regular exercise   

## 2019-09-02 NOTE — Assessment & Plan Note (Signed)
Check lipid panel  Diet controlled Regular exercise and healthy diet encouraged  

## 2019-09-05 ENCOUNTER — Encounter: Payer: Self-pay | Admitting: Internal Medicine

## 2019-09-17 ENCOUNTER — Encounter: Payer: Self-pay | Admitting: Internal Medicine

## 2019-10-28 ENCOUNTER — Other Ambulatory Visit: Payer: Self-pay | Admitting: Internal Medicine

## 2020-03-03 ENCOUNTER — Encounter: Payer: BC Managed Care – PPO | Admitting: Internal Medicine

## 2020-03-09 NOTE — Progress Notes (Signed)
Subjective:    Patient ID: Deanna Woods, female    DOB: Nov 17, 1960, 59 y.o.   MRN: [id number]  HPI She is here for a physical exam.   She denies major concerns or changes in her health.   Medications and allergies reviewed with patient and updated if appropriate.  Patient Active Problem List   Diagnosis Date Noted  . Murmur 02/18/2019  . Basal cell carcinoma (BCC) 02/14/2018  . Hyperlipidemia 01/08/2017  . Hypertension 01/07/2017  . Prediabetes 01/07/2017  . Tobacco abuse 10/26/2015    Current Outpatient Medications on File Prior to Visit  Medication Sig Dispense Refill  . Calcium Carbonate-Vit D-Min (CALCIUM 600+D3 PLUS MINERALS) 600-800 MG-UNIT CHEW Chew 1 each by mouth daily. 60 tablet   . nicotine (NICODERM CQ - DOSED IN MG/24 HOURS) 14 mg/24hr patch APPLY PATCH TO THE UPPER BODY OR ARM EVRY 24 HOURS FOR 2 WEEKS. FOLLOW WITH STEPPED THERAPY 28 patch 1  . valsartan (DIOVAN) 80 MG tablet TAKE 1 TABLET BY MOUTH DAILY. 90 tablet 1   No current facility-administered medications on file prior to visit.    Past Medical History:  Diagnosis Date  . Heart murmur   . Hypertension     Past Surgical History:  Procedure Laterality Date  . TUBAL LIGATION      Social History   Socioeconomic History  . Marital status: Married    Spouse name: Not on file  . Number of children: Not on file  . Years of education: Not on file  . Highest education level: Not on file  Occupational History  . Not on file  Tobacco Use  . Smoking status: Current Every Day Smoker    Packs/day: 0.50    Years: 25.00    Pack years: 12.50  . Smokeless tobacco: Never Used  Substance and Sexual Activity  . Alcohol use: Yes    Comment: weekends  . Drug use: No  . Sexual activity: Not on file  Other Topics Concern  . Not on file  Social History Narrative   Very active, not regular exercise   Social Determinants of Health   Financial Resource Strain:   . Difficulty of Paying Living  Expenses:   Food Insecurity:   . Worried About Charity fundraiser in the Last Year:   . Arboriculturist in the Last Year:   Transportation Needs:   . Film/video editor (Medical):   Marland Kitchen Lack of Transportation (Non-Medical):   Physical Activity:   . Days of Exercise per Week:   . Minutes of Exercise per Session:   Stress:   . Feeling of Stress :   Social Connections:   . Frequency of Communication with Friends and Family:   . Frequency of Social Gatherings with Friends and Family:   . Attends Religious Services:   . Active Member of Clubs or Organizations:   . Attends Archivist Meetings:   Marland Kitchen Marital Status:     Family History  Problem Relation Age of Onset  . Hypertension Mother   . Stroke Mother   . Kidney disease Mother   . Osteoporosis Mother   . Stroke Maternal Grandmother   . Colon cancer Neg Hx     Review of Systems  Constitutional: Negative for chills and fever.  Eyes: Negative for visual disturbance.  Respiratory: Negative for cough, shortness of breath and wheezing.   Cardiovascular: Negative for chest pain, palpitations and leg swelling.  Gastrointestinal: Negative for abdominal pain,  blood in stool, constipation, diarrhea and nausea.       No gerd  Genitourinary: Negative for dysuria and hematuria.  Musculoskeletal: Positive for arthralgias (mild). Negative for back pain.  Skin: Negative for color change and rash.  Neurological: Positive for light-headedness (occ). Negative for dizziness, numbness and headaches.  Psychiatric/Behavioral: Negative for dysphoric mood. The patient is not nervous/anxious.        Objective:   Vitals:   03/10/20 0746  BP: (!) 168/78  Pulse: 94  Resp: 16  Temp: 98.7 F (37.1 C)  SpO2: 99%   Filed Weights   03/10/20 0746  Weight: 170 lb (77.1 kg)   Body mass index is 26.63 kg/m.  BP Readings from Last 3 Encounters:  03/10/20 (!) 168/78  09/02/19 (!) 154/88  02/18/19 136/78    Wt Readings from Last 3  Encounters:  03/10/20 170 lb (77.1 kg)  09/02/19 168 lb 12.8 oz (76.6 kg)  02/18/19 174 lb 8 oz (79.2 kg)     Physical Exam Constitutional: She appears well-developed and well-nourished. No distress.  HENT:  Head: Normocephalic and atraumatic.  Right Ear: External ear normal. Normal ear canal and TM Left Ear: External ear normal.  Normal ear canal and TM Mouth/Throat: Oropharynx is clear and moist.  Eyes: Conjunctivae and EOM are normal.  Neck: Neck supple. No tracheal deviation present. No thyromegaly present.  No carotid bruit  Cardiovascular: Normal rate, regular rhythm and normal heart sounds.   1/6 sys murmur heard.  No edema. Pulmonary/Chest: Effort normal and breath sounds normal. No respiratory distress. She has no wheezes. She has no rales.  Breast: deferred   Abdominal: Soft. She exhibits no distension. There is no tenderness.  Lymphadenopathy: She has no cervical adenopathy.  Skin: Skin is warm and dry. She is not diaphoretic.  Psychiatric: She has a normal mood and affect. Her behavior is normal.        Assessment & Plan:   Physical exam: Screening blood work    ordered Immunizations  Discussed shingrix, covid Colonoscopy  Up to date  Mammogram  Up to date  Gyn  Up to date  Eye exams  Up to date  Exercise   Walking 5 days a week, pilates Weight  BMI 26 - ok for age Substance abuse  Still smoking  - doing better -working on quitting Sees derm annually  See Problem List for Assessment and Plan of chronic medical problems.     This visit occurred during the SARS-CoV-2 public health emergency.  Safety protocols were in place, including screening questions prior to the visit, additional usage of staff PPE, and extensive cleaning of exam room while observing appropriate contact time as indicated for disinfecting solutions.

## 2020-03-09 NOTE — Patient Instructions (Addendum)
Blood work was ordered.    All other Health Maintenance issues reviewed.   All recommended immunizations and age-appropriate screenings are up-to-date or discussed.  No immunization administered today.   Medications reviewed and updated.  Changes include :   none  Your prescription(s) have been submitted to your pharmacy. Please take as directed and contact our office if you believe you are having problem(s) with the medication(s).    Please followup in 6 months    Health Maintenance, Female Adopting a healthy lifestyle and getting preventive care are important in promoting health and wellness. Ask your health care provider about:  The right schedule for you to have regular tests and exams.  Things you can do on your own to prevent diseases and keep yourself healthy. What should I know about diet, weight, and exercise? Eat a healthy diet   Eat a diet that includes plenty of vegetables, fruits, low-fat dairy products, and lean protein.  Do not eat a lot of foods that are high in solid fats, added sugars, or sodium. Maintain a healthy weight Body mass index (BMI) is used to identify weight problems. It estimates body fat based on height and weight. Your health care provider can help determine your BMI and help you achieve or maintain a healthy weight. Get regular exercise Get regular exercise. This is one of the most important things you can do for your health. Most adults should:  Exercise for at least 150 minutes each week. The exercise should increase your heart rate and make you sweat (moderate-intensity exercise).  Do strengthening exercises at least twice a week. This is in addition to the moderate-intensity exercise.  Spend less time sitting. Even light physical activity can be beneficial. Watch cholesterol and blood lipids Have your blood tested for lipids and cholesterol at 58 years of age, then have this test every 5 years. Have your cholesterol levels checked more  often if:  Your lipid or cholesterol levels are high.  You are older than 58 years of age.  You are at high risk for heart disease. What should I know about cancer screening? Depending on your health history and family history, you may need to have cancer screening at various ages. This may include screening for:  Breast cancer.  Cervical cancer.  Colorectal cancer.  Skin cancer.  Lung cancer. What should I know about heart disease, diabetes, and high blood pressure? Blood pressure and heart disease  High blood pressure causes heart disease and increases the risk of stroke. This is more likely to develop in people who have high blood pressure readings, are of African descent, or are overweight.  Have your blood pressure checked: ? Every 3-5 years if you are 18-39 years of age. ? Every year if you are 40 years old or older. Diabetes Have regular diabetes screenings. This checks your fasting blood sugar level. Have the screening done:  Once every three years after age 40 if you are at a normal weight and have a low risk for diabetes.  More often and at a younger age if you are overweight or have a high risk for diabetes. What should I know about preventing infection? Hepatitis B If you have a higher risk for hepatitis B, you should be screened for this virus. Talk with your health care provider to find out if you are at risk for hepatitis B infection. Hepatitis C Testing is recommended for:  Everyone born from 1945 through 1965.  Anyone with known risk factors for hepatitis   C. Sexually transmitted infections (STIs)  Get screened for STIs, including gonorrhea and chlamydia, if: ? You are sexually active and are younger than 58 years of age. ? You are older than 58 years of age and your health care provider tells you that you are at risk for this type of infection. ? Your sexual activity has changed since you were last screened, and you are at increased risk for chlamydia  or gonorrhea. Ask your health care provider if you are at risk.  Ask your health care provider about whether you are at high risk for HIV. Your health care provider may recommend a prescription medicine to help prevent HIV infection. If you choose to take medicine to prevent HIV, you should first get tested for HIV. You should then be tested every 3 months for as long as you are taking the medicine. Pregnancy  If you are about to stop having your period (premenopausal) and you may become pregnant, seek counseling before you get pregnant.  Take 400 to 800 micrograms (mcg) of folic acid every day if you become pregnant.  Ask for birth control (contraception) if you want to prevent pregnancy. Osteoporosis and menopause Osteoporosis is a disease in which the bones lose minerals and strength with aging. This can result in bone fractures. If you are 65 years old or older, or if you are at risk for osteoporosis and fractures, ask your health care provider if you should:  Be screened for bone loss.  Take a calcium or vitamin D supplement to lower your risk of fractures.  Be given hormone replacement therapy (HRT) to treat symptoms of menopause. Follow these instructions at home: Lifestyle  Do not use any products that contain nicotine or tobacco, such as cigarettes, e-cigarettes, and chewing tobacco. If you need help quitting, ask your health care provider.  Do not use street drugs.  Do not share needles.  Ask your health care provider for help if you need support or information about quitting drugs. Alcohol use  Do not drink alcohol if: ? Your health care provider tells you not to drink. ? You are pregnant, may be pregnant, or are planning to become pregnant.  If you drink alcohol: ? Limit how much you use to 0-1 drink a day. ? Limit intake if you are breastfeeding.  Be aware of how much alcohol is in your drink. In the [place] one drink equals one 12 oz bottle of beer (355 mL), one 5 oz  glass of wine (148 mL), or one 1 oz glass of hard liquor (44 mL). General instructions  Schedule regular health, dental, and eye exams.  Stay current with your vaccines.  Tell your health care provider if: ? You often feel depressed. ? You have ever been abused or do not feel safe at home. Summary  Adopting a healthy lifestyle and getting preventive care are important in promoting health and wellness.  Follow your health care provider's instructions about healthy diet, exercising, and getting tested or screened for diseases.  Follow your health care provider's instructions on monitoring your cholesterol and blood pressure. This information is not intended to replace advice given to you by your health care provider. Make sure you discuss any questions you have with your health care provider. Document Revised: 09/18/2018 Document Reviewed: 09/18/2018 Elsevier Patient Education  2020 [employer]  

## 2020-03-10 ENCOUNTER — Encounter: Payer: Self-pay | Admitting: Internal Medicine

## 2020-03-10 ENCOUNTER — Ambulatory Visit (INDEPENDENT_AMBULATORY_CARE_PROVIDER_SITE_OTHER): Payer: BC Managed Care – PPO | Admitting: Internal Medicine

## 2020-03-10 ENCOUNTER — Other Ambulatory Visit: Payer: Self-pay

## 2020-03-10 VITALS — BP 168/78 | HR 94 | Temp 98.7°F | Resp 16 | Ht 67.0 in | Wt 170.0 lb

## 2020-03-10 DIAGNOSIS — Z0000 Encounter for general adult medical examination without abnormal findings: Secondary | ICD-10-CM | POA: Diagnosis not present

## 2020-03-10 DIAGNOSIS — C4441 Basal cell carcinoma of skin of scalp and neck: Secondary | ICD-10-CM

## 2020-03-10 DIAGNOSIS — R7303 Prediabetes: Secondary | ICD-10-CM | POA: Diagnosis not present

## 2020-03-10 DIAGNOSIS — Z720 Tobacco use: Secondary | ICD-10-CM

## 2020-03-10 DIAGNOSIS — I10 Essential (primary) hypertension: Secondary | ICD-10-CM | POA: Diagnosis not present

## 2020-03-10 DIAGNOSIS — E782 Mixed hyperlipidemia: Secondary | ICD-10-CM | POA: Diagnosis not present

## 2020-03-10 LAB — CBC WITH DIFFERENTIAL/PLATELET
Basophils Absolute: 0 10*3/uL (ref 0.0–0.1)
Basophils Relative: 0.5 % (ref 0.0–3.0)
Eosinophils Absolute: 0.1 10*3/uL (ref 0.0–0.7)
Eosinophils Relative: 1.7 % (ref 0.0–5.0)
HCT: 39.2 % (ref 36.0–46.0)
Hemoglobin: 13.1 g/dL (ref 12.0–15.0)
Lymphocytes Relative: 34.5 % (ref 12.0–46.0)
Lymphs Abs: 2.7 10*3/uL (ref 0.7–4.0)
MCHC: 33.4 g/dL (ref 30.0–36.0)
MCV: 91.2 fl (ref 78.0–100.0)
Monocytes Absolute: 0.5 10*3/uL (ref 0.1–1.0)
Monocytes Relative: 6.7 % (ref 3.0–12.0)
Neutro Abs: 4.4 10*3/uL (ref 1.4–7.7)
Neutrophils Relative %: 56.6 % (ref 43.0–77.0)
Platelets: 261 10*3/uL (ref 150.0–400.0)
RBC: 4.3 Mil/uL (ref 3.87–5.11)
RDW: 12.6 % (ref 11.5–15.5)
WBC: 7.7 10*3/uL (ref 4.0–10.5)

## 2020-03-10 LAB — LIPID PANEL
Cholesterol: 227 mg/dL — ABNORMAL HIGH (ref 0–200)
HDL: 51.7 mg/dL (ref >39.00)
LDL Cholesterol: 148 mg/dL — ABNORMAL HIGH (ref 0–99)
NonHDL: 175.78
Total CHOL/HDL Ratio: 4
Triglycerides: 139 mg/dL (ref 0.0–149.0)
VLDL: 27.8 mg/dL (ref 0.0–40.0)

## 2020-03-10 LAB — COMPREHENSIVE METABOLIC PANEL
ALT: 15 U/L (ref 0–35)
AST: 20 U/L (ref 0–37)
Albumin: 4.4 g/dL (ref 3.5–5.2)
Alkaline Phosphatase: 64 U/L (ref 39–117)
BUN: 17 mg/dL (ref 6–23)
CO2: 29 mEq/L (ref 19–32)
Calcium: 9.6 mg/dL (ref 8.4–10.5)
Chloride: 104 mEq/L (ref 96–112)
Creatinine, Ser: 0.79 mg/dL (ref 0.40–1.20)
GFR: 74.89 mL/min (ref >60.00)
Glucose, Bld: 100 mg/dL — ABNORMAL HIGH (ref 70–99)
Potassium: 4.1 mEq/L (ref 3.5–5.1)
Sodium: 137 mEq/L (ref 135–145)
Total Bilirubin: 0.5 mg/dL (ref 0.2–1.2)
Total Protein: 7 g/dL (ref 6.0–8.3)

## 2020-03-10 LAB — HEMOGLOBIN A1C: Hgb A1c MFr Bld: 5.9 % (ref 4.6–6.5)

## 2020-03-10 MED ORDER — VALSARTAN 80 MG PO TABS: 80.0000 mg | ORAL_TABLET | Freq: Every day | ORAL | 1 refills | Status: DC

## 2020-03-10 NOTE — Assessment & Plan Note (Signed)
Chronic BP well controlled at home  - 103/58-129/64 She brought her BP cuff here and it is the same as ours - white coat htn Current regimen effective and well tolerated Continue current medications at current doses cmp

## 2020-03-10 NOTE — Assessment & Plan Note (Signed)
Sees derm annually - up to date 

## 2020-03-10 NOTE — Assessment & Plan Note (Signed)
chronic Working on quitting  - has cut back Deferred any help

## 2020-03-10 NOTE — Assessment & Plan Note (Signed)
Chronic Check a1c Low sugar / carb diet Stressed regular exercise  

## 2020-03-10 NOTE — Assessment & Plan Note (Signed)
Chronic Check lipid panel  Diet controlled Regular exercise and healthy diet encouraged  

## 2020-03-11 ENCOUNTER — Encounter: Payer: Self-pay | Admitting: Internal Medicine

## 2020-09-21 ENCOUNTER — Other Ambulatory Visit: Payer: Self-pay | Admitting: Internal Medicine

## 2020-10-09 DIAGNOSIS — M8580 Other specified disorders of bone density and structure, unspecified site: Secondary | ICD-10-CM

## 2020-10-09 HISTORY — DX: Other specified disorders of bone density and structure, unspecified site: M85.80

## 2021-03-18 ENCOUNTER — Other Ambulatory Visit: Payer: Self-pay | Admitting: Internal Medicine

## 2021-04-10 ENCOUNTER — Other Ambulatory Visit: Payer: Self-pay | Admitting: Internal Medicine

## 2021-05-05 ENCOUNTER — Other Ambulatory Visit: Payer: Self-pay | Admitting: Internal Medicine

## 2021-05-28 ENCOUNTER — Other Ambulatory Visit: Payer: Self-pay | Admitting: Internal Medicine

## 2021-07-18 ENCOUNTER — Telehealth: Payer: Self-pay | Admitting: Internal Medicine

## 2021-07-18 DIAGNOSIS — I10 Essential (primary) hypertension: Secondary | ICD-10-CM

## 2021-07-18 DIAGNOSIS — E782 Mixed hyperlipidemia: Secondary | ICD-10-CM

## 2021-07-18 DIAGNOSIS — R7303 Prediabetes: Secondary | ICD-10-CM

## 2021-07-18 NOTE — Telephone Encounter (Signed)
Patient is requesting provider go ahead & put lab orders in before her scheduled cpe 10/17

## 2021-07-19 NOTE — Telephone Encounter (Signed)
ordered

## 2021-07-20 NOTE — Telephone Encounter (Signed)
Message left for patient. She will need lab appointment scheduled.  Please schedule if she calls back.

## 2021-07-21 ENCOUNTER — Other Ambulatory Visit (INDEPENDENT_AMBULATORY_CARE_PROVIDER_SITE_OTHER): Payer: BC Managed Care – PPO

## 2021-07-21 DIAGNOSIS — E782 Mixed hyperlipidemia: Secondary | ICD-10-CM | POA: Diagnosis not present

## 2021-07-21 DIAGNOSIS — I10 Essential (primary) hypertension: Secondary | ICD-10-CM | POA: Diagnosis not present

## 2021-07-21 DIAGNOSIS — R7303 Prediabetes: Secondary | ICD-10-CM

## 2021-07-21 LAB — CBC WITH DIFFERENTIAL/PLATELET
Basophils Absolute: 0.1 10*3/uL (ref 0.0–0.1)
Basophils Relative: 0.7 % (ref 0.0–3.0)
Eosinophils Absolute: 0.2 10*3/uL (ref 0.0–0.7)
Eosinophils Relative: 2.4 % (ref 0.0–5.0)
HCT: 39.8 % (ref 36.0–46.0)
Hemoglobin: 13.1 g/dL (ref 12.0–15.0)
Lymphocytes Relative: 31.9 % (ref 12.0–46.0)
Lymphs Abs: 2.8 10*3/uL (ref 0.7–4.0)
MCHC: 33 g/dL (ref 30.0–36.0)
MCV: 90.3 fl (ref 78.0–100.0)
Monocytes Absolute: 0.5 10*3/uL (ref 0.1–1.0)
Monocytes Relative: 6.2 % (ref 3.0–12.0)
Neutro Abs: 5.2 10*3/uL (ref 1.4–7.7)
Neutrophils Relative %: 58.8 % (ref 43.0–77.0)
Platelets: 293 10*3/uL (ref 150.0–400.0)
RBC: 4.41 Mil/uL (ref 3.87–5.11)
RDW: 13 % (ref 11.5–15.5)
WBC: 8.8 10*3/uL (ref 4.0–10.5)

## 2021-07-21 LAB — LIPID PANEL
Cholesterol: 228 mg/dL — ABNORMAL HIGH (ref 0–200)
HDL: 52.8 mg/dL (ref >39.00)
LDL Cholesterol: 153 mg/dL — ABNORMAL HIGH (ref 0–99)
NonHDL: 175.03
Total CHOL/HDL Ratio: 4
Triglycerides: 109 mg/dL (ref 0.0–149.0)
VLDL: 21.8 mg/dL (ref 0.0–40.0)

## 2021-07-21 LAB — COMPREHENSIVE METABOLIC PANEL
ALT: 11 U/L (ref 0–35)
AST: 18 U/L (ref 0–37)
Albumin: 4.3 g/dL (ref 3.5–5.2)
Alkaline Phosphatase: 63 U/L (ref 39–117)
BUN: 16 mg/dL (ref 6–23)
CO2: 25 mEq/L (ref 19–32)
Calcium: 9.6 mg/dL (ref 8.4–10.5)
Chloride: 104 mEq/L (ref 96–112)
Creatinine, Ser: 0.82 mg/dL (ref 0.40–1.20)
GFR: 78.62 mL/min (ref >60.00)
Glucose, Bld: 96 mg/dL (ref 70–99)
Potassium: 4.1 mEq/L (ref 3.5–5.1)
Sodium: 137 mEq/L (ref 135–145)
Total Bilirubin: 0.3 mg/dL (ref 0.2–1.2)
Total Protein: 6.9 g/dL (ref 6.0–8.3)

## 2021-07-21 LAB — HEMOGLOBIN A1C: Hgb A1c MFr Bld: 5.8 % (ref 4.6–6.5)

## 2021-07-21 LAB — TSH: TSH: 2.8 u[IU]/mL (ref 0.35–5.50)

## 2021-07-24 ENCOUNTER — Encounter: Payer: Self-pay | Admitting: Internal Medicine

## 2021-07-24 NOTE — Patient Instructions (Addendum)
Flu immunization administered today.     Blood work was ordered.     Medications changes include :  nicotine inhaler   Your prescription(s) have been submitted to your pharmacy. Please take as directed and contact our office if you believe you are having problem(s) with the medication(s).   Please followup in 1  year   Health Maintenance, Female Adopting a healthy lifestyle and getting preventive care are important in promoting health and wellness. Ask your health care provider about: The right schedule for you to have regular tests and exams. Things you can do on your own to prevent diseases and keep yourself healthy. What should I know about diet, weight, and exercise? Eat a healthy diet  Eat a diet that includes plenty of vegetables, fruits, low-fat dairy products, and lean protein. Do not eat a lot of foods that are high in solid fats, added sugars, or sodium. Maintain a healthy weight Body mass index (BMI) is used to identify weight problems. It estimates body fat based on height and weight. Your health care provider can help determine your BMI and help you achieve or maintain a healthy weight. Get regular exercise Get regular exercise. This is one of the most important things you can do for your health. Most adults should: Exercise for at least 150 minutes each week. The exercise should increase your heart rate and make you sweat (moderate-intensity exercise). Do strengthening exercises at least twice a week. This is in addition to the moderate-intensity exercise. Spend less time sitting. Even light physical activity can be beneficial. Watch cholesterol and blood lipids Have your blood tested for lipids and cholesterol at 59 years of age, then have this test every 5 years. Have your cholesterol levels checked more often if: Your lipid or cholesterol levels are high. You are older than 59 years of age. You are at high risk for heart disease. What should I know about cancer  screening? Depending on your health history and family history, you may need to have cancer screening at various ages. This may include screening for: Breast cancer. Cervical cancer. Colorectal cancer. Skin cancer. Lung cancer. What should I know about heart disease, diabetes, and high blood pressure? Blood pressure and heart disease High blood pressure causes heart disease and increases the risk of stroke. This is more likely to develop in people who have high blood pressure readings, are of African descent, or are overweight. Have your blood pressure checked: Every 3-5 years if you are 58-67 years of age. Every year if you are 47 years old or older. Diabetes Have regular diabetes screenings. This checks your fasting blood sugar level. Have the screening done: Once every three years after age 73 if you are at a normal weight and have a low risk for diabetes. More often and at a younger age if you are overweight or have a high risk for diabetes. What should I know about preventing infection? Hepatitis B If you have a higher risk for hepatitis B, you should be screened for this virus. Talk with your health care provider to find out if you are at risk for hepatitis B infection. Hepatitis C Testing is recommended for: Everyone born from [birth year] through 1965. Anyone with known risk factors for hepatitis C. Sexually transmitted infections (STIs) Get screened for STIs, including gonorrhea and chlamydia, if: You are sexually active and are younger than 59 years of age. You are older than 59 years of age and your health care provider tells you that you  are at risk for this type of infection. Your sexual activity has changed since you were last screened, and you are at increased risk for chlamydia or gonorrhea. Ask your health care provider if you are at risk. Ask your health care provider about whether you are at high risk for HIV. Your health care provider may recommend a prescription medicine to  help prevent HIV infection. If you choose to take medicine to prevent HIV, you should first get tested for HIV. You should then be tested every 3 months for as long as you are taking the medicine. Pregnancy If you are about to stop having your period (premenopausal) and you may become pregnant, seek counseling before you get pregnant. Take 400 to 800 micrograms (mcg) of folic acid every day if you become pregnant. Ask for birth control (contraception) if you want to prevent pregnancy. Osteoporosis and menopause Osteoporosis is a disease in which the bones lose minerals and strength with aging. This can result in bone fractures. If you are 74 years old or older, or if you are at risk for osteoporosis and fractures, ask your health care provider if you should: Be screened for bone loss. Take a calcium or vitamin D supplement to lower your risk of fractures. Be given hormone replacement therapy (HRT) to treat symptoms of menopause. Follow these instructions at home: Lifestyle Do not use any products that contain nicotine or tobacco, such as cigarettes, e-cigarettes, and chewing tobacco. If you need help quitting, ask your health care provider. Do not use street drugs. Do not share needles. Ask your health care provider for help if you need support or information about quitting drugs. Alcohol use Do not drink alcohol if: Your health care provider tells you not to drink. You are pregnant, may be pregnant, or are planning to become pregnant. If you drink alcohol: Limit how much you use to 0-1 drink a day. Limit intake if you are breastfeeding. Be aware of how much alcohol is in your drink. In the [place] one drink equals one 12 oz bottle of beer (355 mL), one 5 oz glass of wine (148 mL), or one 1 oz glass of hard liquor (44 mL). General instructions Schedule regular health, dental, and eye exams. Stay current with your vaccines. Tell your health care provider if: You often feel depressed. You  have ever been abused or do not feel safe at home. Summary Adopting a healthy lifestyle and getting preventive care are important in promoting health and wellness. Follow your health care provider's instructions about healthy diet, exercising, and getting tested or screened for diseases. Follow your health care provider's instructions on monitoring your cholesterol and blood pressure. This information is not intended to replace advice given to you by your health care provider. Make sure you discuss any questions you have with your health care provider. Document Revised: 12/03/2020 Document Reviewed: 09/18/2018 Elsevier Patient Education  2022 [employer].

## 2021-07-24 NOTE — Progress Notes (Signed)
Subjective:    Patient ID: Deanna Woods, female    DOB: Apr 26, 1961, 60 y.o.   MRN: [id number]   This visit occurred during the SARS-CoV-2 public health emergency.  Safety protocols were in place, including screening questions prior to the visit, additional usage of staff PPE, and extensive cleaning of exam room while observing appropriate contact time as indicated for disinfecting solutions.    HPI She is here for a physical exam.   BP at home has been good - 113/58, 127/66, 126/60  She has no other concerns.  Medications and allergies reviewed with patient and updated if appropriate.  Patient Active Problem List   Diagnosis Date Noted   Murmur 02/18/2019   Basal cell carcinoma (BCC) 02/14/2018   Hyperlipidemia 01/08/2017   Hypertension 01/07/2017   Prediabetes 01/07/2017   Tobacco dependence due to cigarettes 10/26/2015    Current Outpatient Medications on File Prior to Visit  Medication Sig Dispense Refill   Calcium Carbonate-Vit D-Min (CALCIUM 600+D3 PLUS MINERALS) 600-800 MG-UNIT CHEW Chew 1 each by mouth daily. 60 tablet    Turmeric (QC TUMERIC COMPLEX) 500 MG CAPS      No current facility-administered medications on file prior to visit.    Past Medical History:  Diagnosis Date   Heart murmur    Hypertension     Past Surgical History:  Procedure Laterality Date   TUBAL LIGATION      Social History   Socioeconomic History   Marital status: Married    Spouse name: Not on file   Number of children: Not on file   Years of education: Not on file   Highest education level: Not on file  Occupational History   Not on file  Tobacco Use   Smoking status: Every Day    Packs/day: 0.50    Years: 25.00    Pack years: 12.50    Types: Cigarettes   Smokeless tobacco: Never  Substance and Sexual Activity   Alcohol use: Yes    Comment: weekends   Drug use: No   Sexual activity: Not on file  Other Topics Concern   Not on file  Social History Narrative    Very active, not regular exercise   Social Determinants of Health   Financial Resource Strain: Not on file  Food Insecurity: Not on file  Transportation Needs: Not on file  Physical Activity: Not on file  Stress: Not on file  Social Connections: Not on file    Family History  Problem Relation Age of Onset   Hypertension Mother    Stroke Mother    Kidney disease Mother    Osteoporosis Mother    Stroke Maternal Grandmother    Colon cancer Neg Hx     Review of Systems  Constitutional:  Negative for chills and fever.  Eyes:  Negative for visual disturbance.  Respiratory:  Negative for cough, shortness of breath and wheezing.   Cardiovascular:  Positive for palpitations (occ- very transient). Negative for chest pain.  Gastrointestinal:  Negative for abdominal pain, blood in stool, constipation, diarrhea and nausea.       No gerd  Genitourinary:  Negative for dysuria.  Musculoskeletal:  Positive for arthralgias (mild, stiffness). Negative for back pain.  Skin:  Negative for color change and rash.  Neurological:  Negative for light-headedness and headaches.  Psychiatric/Behavioral:  Negative for dysphoric mood. The patient is not nervous/anxious.       Objective:   Vitals:   07/25/21 1501  BP: Marland Kitchen)  146/80  Pulse: 80  Temp: 98.1 F (36.7 C)  SpO2: 98%   Filed Weights   07/25/21 1501  Weight: 175 lb (79.4 kg)   Body mass index is 27.41 kg/m.  BP Readings from Last 3 Encounters:  07/25/21 (!) 146/80  03/10/20 (!) 168/78  09/02/19 (!) 154/88    Wt Readings from Last 3 Encounters:  07/25/21 175 lb (79.4 kg)  03/10/20 170 lb (77.1 kg)  09/02/19 168 lb 12.8 oz (76.6 kg)    Depression screen [facility] 2/9 07/25/2021 09/02/2019 02/18/2019 02/14/2018  Decreased Interest 0 0 0 0  Down, Depressed, Hopeless 0 0 0 0  PHQ - 2 Score 0 0 0 0  Altered sleeping 0 - - -  Tired, decreased energy 0 - - -  Change in appetite 0 - - -  Feeling bad or failure about yourself  0 - - -   Trouble concentrating 0 - - -  Moving slowly or fidgety/restless 0 - - -  Suicidal thoughts 0 - - -  PHQ-9 Score 0 - - -    GAD 7 : Generalized Anxiety Score 07/25/2021  Nervous, Anxious, on Edge 0  Control/stop worrying 0  Worry too much - different things 0  Trouble relaxing 0  Restless 0  Easily annoyed or irritable 0  Afraid - awful might happen 0  Total GAD 7 Score 0       Physical Exam Constitutional: She appears well-developed and well-nourished. No distress.  HENT:  Head: Normocephalic and atraumatic.  Right Ear: External ear normal. Normal ear canal and TM Left Ear: External ear normal.  Normal ear canal and TM Mouth/Throat: Oropharynx is clear and moist.  Eyes: Conjunctivae and EOM are normal.  Neck: Neck supple. No tracheal deviation present. No thyromegaly present.  No carotid bruit  Cardiovascular: Normal rate, regular rhythm and normal heart sounds.   No murmur heard.  No edema. Pulmonary/Chest: Effort normal and breath sounds normal. No respiratory distress. She has no wheezes. She has no rales.  Breast: deferred   Abdominal: Soft. She exhibits no distension. There is no tenderness.  Lymphadenopathy: She has no cervical adenopathy.  Skin: Skin is warm and dry. She is not diaphoretic.  Psychiatric: She has a normal mood and affect. Her behavior is normal.     Lab Results  Component Value Date   WBC 8.8 07/21/2021   HGB 13.1 07/21/2021   HCT 39.8 07/21/2021   PLT 293.0 07/21/2021   GLUCOSE 96 07/21/2021   CHOL 228 (H) 07/21/2021   TRIG 109.0 07/21/2021   HDL 52.80 07/21/2021   LDLCALC 153 (H) 07/21/2021   ALT 11 07/21/2021   AST 18 07/21/2021   NA 137 07/21/2021   K 4.1 07/21/2021   CL 104 07/21/2021   CREATININE 0.82 07/21/2021   BUN 16 07/21/2021   CO2 25 07/21/2021   TSH 2.80 07/21/2021   HGBA1C 5.8 07/21/2021    The 10-year ASCVD risk score (Arnett DK, et al., 2019) is: 21.6%   Values used to calculate the score:     Age: 79 years      Sex: Female     Is Non-Hispanic African American: No     Diabetic: Yes     Tobacco smoker: Yes     Systolic Blood Pressure: 798 mmHg     Is BP treated: Yes     HDL Cholesterol: 52.8 mg/dL     Total Cholesterol: 228 mg/dL   This is above calculation is an  accurate-she is not a diabetic     Assessment & Plan:   Physical exam: Screening blood work  reviewed Exercise  walking 5/week Weight  ok for age  Substance abuse  none derm annually-history of basal cell carcinoma   Screened for depression using the PHQ 9 scale.  No evidence of depression.   Screened for anxiety using GAD7 Scale.  No evidence of anxiety.    Reviewed recommended immunizations.  Flu vaccine today.   Health Maintenance  Topic Date Due   MAMMOGRAM  10/09/2019   INFLUENZA VACCINE  05/09/2021   Zoster Vaccines- Shingrix (1 of 2) 10/25/2021 (Originally 09/18/1981)   COLONOSCOPY (Pts 45-25yrs Insurance coverage will need to be confirmed)  10/18/2021   PAP SMEAR-Modifier  08/27/2022   TETANUS/TDAP  10/25/2025   Hepatitis C Screening  Completed   HIV Screening  Completed   HPV VACCINES  Aged Out   COVID-19 Vaccine  Discontinued          See Problem List for Assessment and Plan of chronic medical problems.

## 2021-07-25 ENCOUNTER — Other Ambulatory Visit: Payer: Self-pay

## 2021-07-25 ENCOUNTER — Ambulatory Visit (INDEPENDENT_AMBULATORY_CARE_PROVIDER_SITE_OTHER): Payer: BC Managed Care – PPO | Admitting: Internal Medicine

## 2021-07-25 VITALS — BP 146/80 | HR 80 | Temp 98.1°F | Ht 67.0 in | Wt 175.0 lb

## 2021-07-25 DIAGNOSIS — R7303 Prediabetes: Secondary | ICD-10-CM | POA: Diagnosis not present

## 2021-07-25 DIAGNOSIS — Z0000 Encounter for general adult medical examination without abnormal findings: Secondary | ICD-10-CM | POA: Diagnosis not present

## 2021-07-25 DIAGNOSIS — Z1331 Encounter for screening for depression: Secondary | ICD-10-CM

## 2021-07-25 DIAGNOSIS — E782 Mixed hyperlipidemia: Secondary | ICD-10-CM

## 2021-07-25 DIAGNOSIS — F17210 Nicotine dependence, cigarettes, uncomplicated: Secondary | ICD-10-CM | POA: Diagnosis not present

## 2021-07-25 DIAGNOSIS — I10 Essential (primary) hypertension: Secondary | ICD-10-CM

## 2021-07-25 MED ORDER — VALSARTAN 80 MG PO TABS: 80.0000 mg | ORAL_TABLET | Freq: Every day | ORAL | 3 refills | Status: DC

## 2021-07-25 MED ORDER — NICOTINE 10 MG IN INHA: 1.0000 | RESPIRATORY_TRACT | 2 refills | Status: AC | PRN

## 2021-07-25 NOTE — Assessment & Plan Note (Signed)
Chronic Blood pressure well controlled at home, but tends to be higher here.  She has had her blood pressure cuff calibrated here in the past and is accurate CMP reviewed Continue valsartan 80 mg daily

## 2021-07-25 NOTE — Assessment & Plan Note (Signed)
Chronic Lab Results  Component Value Date   HGBA1C 5.8 07/21/2021    Low sugar / carb diet Continue regular exercise

## 2021-07-25 NOTE — Assessment & Plan Note (Signed)
Smoking cessation was discussed for 3-1/2 minutes.  The patient was counseled on the dangers of tobacco use, and was advised to quit and Wants to quit.  She wondered about nicotine inhalers.  She did use this once before and it did help.  We reviewed ways of quitting smoking including nicotine replacement, vapping/e-cigarettes which I do not recommend, cold [place], weaning off cigarettes, smoking the same amount of cigarettes but at different times to help break the habit of smoking and pharmacotherapy (wellbutrin).  She would like to try the nicotine inhalers if covered by insurance.  I did send a prescription to her pharmacy-I am not sure if it is covered.  Plan B could be either nicotine patches, which she has used in the past but did cause some skin irritation and/or Wellbutrin which she has not tried in the past.  Discussed possible side effects with the medication and that may help slightly with craving cigarettes.

## 2021-07-25 NOTE — Assessment & Plan Note (Signed)
Chronic Regular exercise and healthy diet encouraged LDL above goal Diet controlled She does have an elevated ASCVD risk-her ASCVD risk by computer is falsely elevated because it counts are as a diabetic which she is not-she only has prediabetes Her ASCVD risk calculated elsewhere is 9-12% She deferred taking a statin at this time She will continue to work on lifestyle

## 2021-08-17 ENCOUNTER — Encounter: Payer: BC Managed Care – PPO | Admitting: Internal Medicine

## 2021-12-14 LAB — HM PAP SMEAR: HM Pap smear: NORMAL

## 2021-12-14 LAB — HM MAMMOGRAPHY

## 2022-02-13 ENCOUNTER — Encounter: Payer: Self-pay | Admitting: Internal Medicine

## 2022-07-03 ENCOUNTER — Telehealth: Payer: Self-pay

## 2022-07-03 NOTE — Telephone Encounter (Signed)
Error

## 2022-07-16 ENCOUNTER — Other Ambulatory Visit: Payer: Self-pay | Admitting: Internal Medicine

## 2022-08-02 ENCOUNTER — Encounter: Payer: Self-pay | Admitting: Internal Medicine

## 2022-08-02 NOTE — Patient Instructions (Addendum)
Blood work was ordered.   The lab is on the first floor.    Medications changes include :   Wellbutrin xl 150 mg daily in the morning.     Return in about 1 year (around 08/04/2023) for Physical Exam.     Health Maintenance, Female Adopting a healthy lifestyle and getting preventive care are important in promoting health and wellness. Ask your health care provider about: The right schedule for you to have regular tests and exams. Things you can do on your own to prevent diseases and keep yourself healthy. What should I know about diet, weight, and exercise? Eat a healthy diet  Eat a diet that includes plenty of vegetables, fruits, low-fat dairy products, and lean protein. Do not eat a lot of foods that are high in solid fats, added sugars, or sodium. Maintain a healthy weight Body mass index (BMI) is used to identify weight problems. It estimates body fat based on height and weight. Your health care provider can help determine your BMI and help you achieve or maintain a healthy weight. Get regular exercise Get regular exercise. This is one of the most important things you can do for your health. Most adults should: Exercise for at least 150 minutes each week. The exercise should increase your heart rate and make you sweat (moderate-intensity exercise). Do strengthening exercises at least twice a week. This is in addition to the moderate-intensity exercise. Spend less time sitting. Even light physical activity can be beneficial. Watch cholesterol and blood lipids Have your blood tested for lipids and cholesterol at 60 years of age, then have this test every 5 years. Have your cholesterol levels checked more often if: Your lipid or cholesterol levels are high. You are older than 60 years of age. You are at high risk for heart disease. What should I know about cancer screening? Depending on your health history and family history, you may need to have cancer screening at  various ages. This may include screening for: Breast cancer. Cervical cancer. Colorectal cancer. Skin cancer. Lung cancer. What should I know about heart disease, diabetes, and high blood pressure? Blood pressure and heart disease High blood pressure causes heart disease and increases the risk of stroke. This is more likely to develop in people who have high blood pressure readings or are overweight. Have your blood pressure checked: Every 3-5 years if you are 65-53 years of age. Every year if you are 45 years old or older. Diabetes Have regular diabetes screenings. This checks your fasting blood sugar level. Have the screening done: Once every three years after age 26 if you are at a normal weight and have a low risk for diabetes. More often and at a younger age if you are overweight or have a high risk for diabetes. What should I know about preventing infection? Hepatitis B If you have a higher risk for hepatitis B, you should be screened for this virus. Talk with your health care provider to find out if you are at risk for hepatitis B infection. Hepatitis C Testing is recommended for: Everyone born from [birth year] through 1964. Anyone with known risk factors for hepatitis C. Sexually transmitted infections (STIs) Get screened for STIs, including gonorrhea and chlamydia, if: You are sexually active and are younger than 60 years of age. You are older than 59 years of age and your health care provider tells you that you are at risk for this type of infection. Your sexual activity has changed  since you were last screened, and you are at increased risk for chlamydia or gonorrhea. Ask your health care provider if you are at risk. Ask your health care provider about whether you are at high risk for HIV. Your health care provider may recommend a prescription medicine to help prevent HIV infection. If you choose to take medicine to prevent HIV, you should first get tested for HIV. You should then be  tested every 3 months for as long as you are taking the medicine. Pregnancy If you are about to stop having your period (premenopausal) and you may become pregnant, seek counseling before you get pregnant. Take 400 to 800 micrograms (mcg) of folic acid every day if you become pregnant. Ask for birth control (contraception) if you want to prevent pregnancy. Osteoporosis and menopause Osteoporosis is a disease in which the bones lose minerals and strength with aging. This can result in bone fractures. If you are 52 years old or older, or if you are at risk for osteoporosis and fractures, ask your health care provider if you should: Be screened for bone loss. Take a calcium or vitamin D supplement to lower your risk of fractures. Be given hormone replacement therapy (HRT) to treat symptoms of menopause. Follow these instructions at home: Alcohol use Do not drink alcohol if: Your health care provider tells you not to drink. You are pregnant, may be pregnant, or are planning to become pregnant. If you drink alcohol: Limit how much you have to: 0-1 drink a day. Know how much alcohol is in your drink. In the [place] one drink equals one 12 oz bottle of beer (355 mL), one 5 oz glass of wine (148 mL), or one 1 oz glass of hard liquor (44 mL). Lifestyle Do not use any products that contain nicotine or tobacco. These products include cigarettes, chewing tobacco, and vaping devices, such as e-cigarettes. If you need help quitting, ask your health care provider. Do not use street drugs. Do not share needles. Ask your health care provider for help if you need support or information about quitting drugs. General instructions Schedule regular health, dental, and eye exams. Stay current with your vaccines. Tell your health care provider if: You often feel depressed. You have ever been abused or do not feel safe at home. Summary Adopting a healthy lifestyle and getting preventive care are important in  promoting health and wellness. Follow your health care provider's instructions about healthy diet, exercising, and getting tested or screened for diseases. Follow your health care provider's instructions on monitoring your cholesterol and blood pressure. This information is not intended to replace advice given to you by your health care provider. Make sure you discuss any questions you have with your health care provider. Document Revised: 02/14/2021 Document Reviewed: 02/14/2021 Elsevier Patient Education  [place].

## 2022-08-02 NOTE — Progress Notes (Unsigned)
Subjective:    Patient ID: Deanna Woods, female    DOB: 1961/06/30, 61 y.o.   MRN: [id number]  915    Hooper is here for a Physical exam.    She has no concerns.  She has a new granddaughter who is 33 months old.  She knows she needs to quit smoking.  She is using the nicotine inhalers and wonders what else she can do.   Medications and allergies reviewed with patient and updated if appropriate.  Current Outpatient Medications on File Prior to Visit  Medication Sig Dispense Refill   Calcium Carbonate-Vit D-Min (CALCIUM 600+D3 PLUS MINERALS) 600-800 MG-UNIT CHEW Chew 1 each by mouth daily. 60 tablet    nicotine (NICOTROL) 10 MG inhaler Inhale 1 Cartridge (1 continuous puffing total) into the lungs as needed for smoking cessation. 168 each 2   Turmeric (QC TUMERIC COMPLEX) 500 MG CAPS      valsartan (DIOVAN) 80 MG tablet TAKE 1 TABLET BY MOUTH EVERY DAY 90 tablet 3   No current facility-administered medications on file prior to visit.    Review of Systems  Constitutional:  Negative for fever.  Eyes:  Negative for visual disturbance.  Respiratory:  Negative for cough, shortness of breath and wheezing.   Cardiovascular:  Negative for chest pain, palpitations and leg swelling.  Gastrointestinal:  Negative for abdominal pain, blood in stool, constipation, diarrhea and nausea.       No gerd  Genitourinary:  Negative for dysuria.  Musculoskeletal:  Positive for arthralgias (mild hands). Negative for back pain.  Skin:  Negative for rash.  Neurological:  Positive for light-headedness (occ when standing up). Negative for headaches.  Psychiatric/Behavioral:  Negative for dysphoric mood. The patient is not nervous/anxious.        Objective:   Vitals:   08/03/22 0851 08/03/22 0939  BP: (!) 144/82 132/82  Pulse: 80   Temp: 98.2 F (36.8 C)   SpO2: 99%    Filed Weights   08/03/22 0851  Weight: 176 lb (79.8 kg)   Body mass index is 27.57 kg/m.  BP Readings from Last  3 Encounters:  08/03/22 132/82  07/25/21 (!) 146/80  03/10/20 (!) 168/78    Wt Readings from Last 3 Encounters:  08/03/22 176 lb (79.8 kg)  07/25/21 175 lb (79.4 kg)  03/10/20 170 lb (77.1 kg)       Physical Exam Constitutional: She appears well-developed and well-nourished. No distress.  HENT:  Head: Normocephalic and atraumatic.  Right Ear: External ear normal. Normal ear canal and TM Left Ear: External ear normal.  Normal ear canal and TM Mouth/Throat: Oropharynx is clear and moist.  Eyes: Conjunctivae normal.  Neck: Neck supple. No tracheal deviation present. No thyromegaly present.  No carotid bruit  Cardiovascular: Normal rate, regular rhythm and normal heart sounds.   No murmur heard.  No edema. Pulmonary/Chest: Effort normal and breath sounds normal. No respiratory distress. She has no wheezes. She has no rales.  Breast: deferred   Abdominal: Soft. She exhibits no distension. There is no tenderness.  Lymphadenopathy: She has no cervical adenopathy.  Skin: Skin is warm and dry. She is not diaphoretic.  Psychiatric: She has a normal mood and affect. Her behavior is normal.     Lab Results  Component Value Date   WBC 8.8 07/21/2021   HGB 13.1 07/21/2021   HCT 39.8 07/21/2021   PLT 293.0 07/21/2021   GLUCOSE 96 07/21/2021   CHOL 228 (H) 07/21/2021  TRIG 109.0 07/21/2021   HDL 52.80 07/21/2021   LDLCALC 153 (H) 07/21/2021   ALT 11 07/21/2021   AST 18 07/21/2021   NA 137 07/21/2021   K 4.1 07/21/2021   CL 104 07/21/2021   CREATININE 0.82 07/21/2021   BUN 16 07/21/2021   CO2 25 07/21/2021   TSH 2.80 07/21/2021   HGBA1C 5.8 07/21/2021    The 10-year ASCVD risk score (Arnett DK, et al., 2019) is: 19.1%   Values used to calculate the score:     Age: 6 years     Sex: Female     Is Non-Hispanic African American: No     Diabetic: Yes     Tobacco smoker: Yes     Systolic Blood Pressure: 793 mmHg     Is BP treated: Yes     HDL Cholesterol: 52.8 mg/dL      Total Cholesterol: 228 mg/dL      Assessment & Plan:   Physical exam: Screening blood work  ordered Exercise  walking , pilates Weight  overweight Substance abuse  none   Reviewed recommended immunizations.   Health Maintenance  Topic Date Due   MAMMOGRAM  10/09/2019   COLONOSCOPY (Pts 45-71yr Insurance coverage will need to be confirmed)  10/18/2021   PAP SMEAR-Modifier  08/27/2022   Zoster Vaccines- Shingrix (1 of 2) 11/03/2022 (Originally 09/18/1981)   INFLUENZA VACCINE  01/07/2023 (Originally 05/09/2022)   TETANUS/TDAP  10/25/2025   Hepatitis C Screening  Completed   HIV Screening  Completed   HPV VACCINES  Aged Out   COVID-19 Vaccine  Discontinued          See Problem List for Assessment and Plan of chronic medical problems.

## 2022-08-03 ENCOUNTER — Ambulatory Visit (INDEPENDENT_AMBULATORY_CARE_PROVIDER_SITE_OTHER): Payer: BC Managed Care – PPO | Admitting: Internal Medicine

## 2022-08-03 VITALS — BP 132/82 | HR 80 | Temp 98.2°F | Ht 67.0 in | Wt 176.0 lb

## 2022-08-03 DIAGNOSIS — I10 Essential (primary) hypertension: Secondary | ICD-10-CM

## 2022-08-03 DIAGNOSIS — E782 Mixed hyperlipidemia: Secondary | ICD-10-CM | POA: Diagnosis not present

## 2022-08-03 DIAGNOSIS — F17210 Nicotine dependence, cigarettes, uncomplicated: Secondary | ICD-10-CM | POA: Diagnosis not present

## 2022-08-03 DIAGNOSIS — Z0000 Encounter for general adult medical examination without abnormal findings: Secondary | ICD-10-CM

## 2022-08-03 DIAGNOSIS — R7303 Prediabetes: Secondary | ICD-10-CM | POA: Diagnosis not present

## 2022-08-03 LAB — COMPREHENSIVE METABOLIC PANEL
ALT: 14 U/L (ref 0–35)
AST: 19 U/L (ref 0–37)
Albumin: 4.5 g/dL (ref 3.5–5.2)
Alkaline Phosphatase: 68 U/L (ref 39–117)
BUN: 16 mg/dL (ref 6–23)
CO2: 29 mEq/L (ref 19–32)
Calcium: 10.4 mg/dL (ref 8.4–10.5)
Chloride: 102 mEq/L (ref 96–112)
Creatinine, Ser: 0.85 mg/dL (ref 0.40–1.20)
GFR: 74.75 mL/min (ref >60.00)
Glucose, Bld: 93 mg/dL (ref 70–99)
Potassium: 4 mEq/L (ref 3.5–5.1)
Sodium: 138 mEq/L (ref 135–145)
Total Bilirubin: 0.4 mg/dL (ref 0.2–1.2)
Total Protein: 7.4 g/dL (ref 6.0–8.3)

## 2022-08-03 LAB — CBC WITH DIFFERENTIAL/PLATELET
Basophils Absolute: 0 10*3/uL (ref 0.0–0.1)
Basophils Relative: 0.4 % (ref 0.0–3.0)
Eosinophils Absolute: 0.2 10*3/uL (ref 0.0–0.7)
Eosinophils Relative: 1.6 % (ref 0.0–5.0)
HCT: 41.4 % (ref 36.0–46.0)
Hemoglobin: 13.7 g/dL (ref 12.0–15.0)
Lymphocytes Relative: 26.9 % (ref 12.0–46.0)
Lymphs Abs: 2.5 10*3/uL (ref 0.7–4.0)
MCHC: 33 g/dL (ref 30.0–36.0)
MCV: 92.1 fl (ref 78.0–100.0)
Monocytes Absolute: 0.6 10*3/uL (ref 0.1–1.0)
Monocytes Relative: 6.8 % (ref 3.0–12.0)
Neutro Abs: 6 10*3/uL (ref 1.4–7.7)
Neutrophils Relative %: 64.3 % (ref 43.0–77.0)
Platelets: 297 10*3/uL (ref 150.0–400.0)
RBC: 4.5 Mil/uL (ref 3.87–5.11)
RDW: 13 % (ref 11.5–15.5)
WBC: 9.3 10*3/uL (ref 4.0–10.5)

## 2022-08-03 LAB — LIPID PANEL
Cholesterol: 237 mg/dL — ABNORMAL HIGH (ref 0–200)
HDL: 52 mg/dL (ref >39.00)
LDL Cholesterol: 151 mg/dL — ABNORMAL HIGH (ref 0–99)
NonHDL: 185.35
Total CHOL/HDL Ratio: 5
Triglycerides: 173 mg/dL — ABNORMAL HIGH (ref 0.0–149.0)
VLDL: 34.6 mg/dL (ref 0.0–40.0)

## 2022-08-03 LAB — TSH: TSH: 2.04 u[IU]/mL (ref 0.35–5.50)

## 2022-08-03 LAB — HEMOGLOBIN A1C: Hgb A1c MFr Bld: 5.8 % (ref 4.6–6.5)

## 2022-08-03 MED ORDER — BUPROPION HCL ER (XL) 150 MG PO TB24: 150.0000 mg | ORAL_TABLET | Freq: Every day | ORAL | 5 refills | Status: DC

## 2022-08-03 NOTE — Assessment & Plan Note (Addendum)
Smoking cessation was discussed for just 4-1/2 minutes.  The patient was counseled on the dangers of tobacco use.  Discussed elevated ASCVD risk.  She and was advised to quit.  She is actively working on quitting, but feels she needs additional help.  She is using the nicotine inhalers, but still smoking.  Reviewed ways of quitting smoking including nicotine replacement, which she is currently using, vapping/e-cigarettes, cold [place], weaning off cigarettes, and pharmacotherapy (wellbutrin and chantix and side effects of these medications).  The Chantix makes her nervous because of the possible side effects.  She is willing to try the Wellbutrin.  Discussed that if she does not tolerate it well or has any side effects she can stop it Start Wellbutrin XL 150 mg daily

## 2022-08-03 NOTE — Assessment & Plan Note (Addendum)
Chronic Blood pressure initially slightly elevated here but well controlled at home - 130/60's - is the highest CMP Continue valsartan 80 mg daily

## 2022-08-03 NOTE — Assessment & Plan Note (Addendum)
Chronic Regular exercise and healthy diet encouraged Check lipid panel CMP,  TSH Elevated ASCVD risk Recommended starting statin-she will think about this

## 2022-08-03 NOTE — Assessment & Plan Note (Signed)
Chronic Check a1c Low sugar / carb diet Stressed regular exercise  

## 2022-08-17 ENCOUNTER — Encounter: Payer: Self-pay | Admitting: Internal Medicine

## 2022-08-17 NOTE — Progress Notes (Signed)
Outside notes received. Information abstracted. Notes sent to scan.  

## 2022-08-31 ENCOUNTER — Other Ambulatory Visit: Payer: Self-pay | Admitting: Internal Medicine

## 2023-07-19 ENCOUNTER — Other Ambulatory Visit: Payer: Self-pay | Admitting: Internal Medicine

## 2023-07-26 ENCOUNTER — Other Ambulatory Visit: Payer: Self-pay

## 2023-07-26 DIAGNOSIS — Z0000 Encounter for general adult medical examination without abnormal findings: Secondary | ICD-10-CM

## 2023-07-26 DIAGNOSIS — I10 Essential (primary) hypertension: Secondary | ICD-10-CM

## 2023-07-26 DIAGNOSIS — R7303 Prediabetes: Secondary | ICD-10-CM

## 2023-07-26 DIAGNOSIS — E782 Mixed hyperlipidemia: Secondary | ICD-10-CM

## 2023-08-20 ENCOUNTER — Other Ambulatory Visit (INDEPENDENT_AMBULATORY_CARE_PROVIDER_SITE_OTHER): Payer: BC Managed Care – PPO

## 2023-08-20 DIAGNOSIS — E782 Mixed hyperlipidemia: Secondary | ICD-10-CM

## 2023-08-20 DIAGNOSIS — R7303 Prediabetes: Secondary | ICD-10-CM

## 2023-08-20 DIAGNOSIS — Z0000 Encounter for general adult medical examination without abnormal findings: Secondary | ICD-10-CM

## 2023-08-20 DIAGNOSIS — I10 Essential (primary) hypertension: Secondary | ICD-10-CM | POA: Diagnosis not present

## 2023-08-20 LAB — CBC WITH DIFFERENTIAL/PLATELET
Basophils Absolute: 0 10*3/uL (ref 0.0–0.1)
Basophils Relative: 0.5 % (ref 0.0–3.0)
Eosinophils Absolute: 0.2 10*3/uL (ref 0.0–0.7)
Eosinophils Relative: 2.2 % (ref 0.0–5.0)
HCT: 41.2 % (ref 36.0–46.0)
Hemoglobin: 13.6 g/dL (ref 12.0–15.0)
Lymphocytes Relative: 34.6 % (ref 12.0–46.0)
Lymphs Abs: 3.2 10*3/uL (ref 0.7–4.0)
MCHC: 33 g/dL (ref 30.0–36.0)
MCV: 91.1 fL (ref 78.0–100.0)
Monocytes Absolute: 0.6 10*3/uL (ref 0.1–1.0)
Monocytes Relative: 6.2 % (ref 3.0–12.0)
Neutro Abs: 5.2 10*3/uL (ref 1.4–7.7)
Neutrophils Relative %: 56.5 % (ref 43.0–77.0)
Platelets: 303 10*3/uL (ref 150.0–400.0)
RBC: 4.52 Mil/uL (ref 3.87–5.11)
RDW: 13.1 % (ref 11.5–15.5)
WBC: 9.2 10*3/uL (ref 4.0–10.5)

## 2023-08-20 LAB — LIPID PANEL
Cholesterol: 249 mg/dL — ABNORMAL HIGH (ref 0–200)
HDL: 46.4 mg/dL (ref >39.00)
LDL Cholesterol: 162 mg/dL — ABNORMAL HIGH (ref 0–99)
NonHDL: 202.78
Total CHOL/HDL Ratio: 5
Triglycerides: 203 mg/dL — ABNORMAL HIGH (ref 0.0–149.0)
VLDL: 40.6 mg/dL — ABNORMAL HIGH (ref 0.0–40.0)

## 2023-08-20 LAB — COMPREHENSIVE METABOLIC PANEL
ALT: 12 U/L (ref 0–35)
AST: 17 U/L (ref 0–37)
Albumin: 4.2 g/dL (ref 3.5–5.2)
Alkaline Phosphatase: 68 U/L (ref 39–117)
BUN: 17 mg/dL (ref 6–23)
CO2: 26 meq/L (ref 19–32)
Calcium: 9.5 mg/dL (ref 8.4–10.5)
Chloride: 104 meq/L (ref 96–112)
Creatinine, Ser: 0.91 mg/dL (ref 0.40–1.20)
GFR: 68.37 mL/min (ref >60.00)
Glucose, Bld: 91 mg/dL (ref 70–99)
Potassium: 4.2 meq/L (ref 3.5–5.1)
Sodium: 138 meq/L (ref 135–145)
Total Bilirubin: 0.4 mg/dL (ref 0.2–1.2)
Total Protein: 7.1 g/dL (ref 6.0–8.3)

## 2023-08-20 LAB — HEMOGLOBIN A1C: Hgb A1c MFr Bld: 5.9 % (ref 4.6–6.5)

## 2023-08-20 LAB — TSH: TSH: 2.61 u[IU]/mL (ref 0.35–5.50)

## 2023-08-23 NOTE — Progress Notes (Signed)
Subjective:    Patient ID: Deanna Woods, female    DOB: 24-Mar-1961, 62 y.o.   MRN: [id number]      HPI Deanna Woods is here for a Physical exam and her chronic medical problems.    Still walking, lifting some weights.  She is working on quitting smoking.  She is doing quite well.   Medications and allergies reviewed with patient and updated if appropriate.  Current Outpatient Medications on File Prior to Visit  Medication Sig Dispense Refill   Calcium Carbonate-Vit D-Min (CALCIUM 600+D3 PLUS MINERALS) 600-800 MG-UNIT CHEW Chew 1 each by mouth daily. 60 tablet    nicotine (NICOTROL) 10 MG inhaler Inhale 1 Cartridge (1 continuous puffing total) into the lungs as needed for smoking cessation. 168 each 2   Turmeric (QC TUMERIC COMPLEX) 500 MG CAPS      valsartan (DIOVAN) 80 MG tablet TAKE 1 TABLET BY MOUTH EVERY DAY 90 tablet 3   No current facility-administered medications on file prior to visit.    Review of Systems  Constitutional:  Negative for fever.  Eyes:  Negative for visual disturbance.  Respiratory:  Negative for cough, shortness of breath and wheezing.   Cardiovascular:  Negative for chest pain, palpitations and leg swelling.  Gastrointestinal:  Negative for abdominal pain, blood in stool, constipation and diarrhea.       No gerd  Genitourinary:  Negative for dysuria.  Musculoskeletal:  Positive for arthralgias (arthritis). Negative for back pain.  Skin:  Positive for rash (chronic - lower arms - sees derm).  Neurological:  Negative for light-headedness and headaches.  Psychiatric/Behavioral:  Negative for dysphoric mood. The patient is not nervous/anxious.        Objective:   Vitals:   08/24/23 1517  BP: 136/72  Pulse: 82  Temp: 98.2 F (36.8 C)  SpO2: 93%   Filed Weights   08/24/23 1517  Weight: 185 lb (83.9 kg)   Body mass index is 28.98 kg/m.  BP Readings from Last 3 Encounters:  08/24/23 136/72  08/03/22 132/82  07/25/21 (!) 146/80    Wt  Readings from Last 3 Encounters:  08/24/23 185 lb (83.9 kg)  08/03/22 176 lb (79.8 kg)  07/25/21 175 lb (79.4 kg)       Physical Exam Constitutional: She appears well-developed and well-nourished. No distress.  HENT:  Head: Normocephalic and atraumatic.  Right Ear: External ear normal. Normal ear canal and TM Left Ear: External ear normal.  Normal ear canal and TM Mouth/Throat: Oropharynx is clear and moist.  Eyes: Conjunctivae normal.  Neck: Neck supple. No tracheal deviation present. No thyromegaly present.  No carotid bruit  Cardiovascular: Normal rate, regular rhythm and normal heart sounds.   No murmur heard.  No edema. Pulmonary/Chest: Effort normal and breath sounds normal. No respiratory distress. She has no wheezes. She has no rales.  Breast: deferred   Abdominal: Soft. She exhibits no distension. There is no tenderness.  Lymphadenopathy: She has no cervical adenopathy.  Skin: Skin is warm and dry. She is not diaphoretic.  Psychiatric: She has a normal mood and affect. Her behavior is normal.     Lab Results  Component Value Date   WBC 9.2 08/20/2023   HGB 13.6 08/20/2023   HCT 41.2 08/20/2023   PLT 303.0 08/20/2023   GLUCOSE 91 08/20/2023   CHOL 249 (H) 08/20/2023   TRIG 203.0 (H) 08/20/2023   HDL 46.40 08/20/2023   LDLCALC 162 (H) 08/20/2023   ALT 12 08/20/2023  AST 17 08/20/2023   NA 138 08/20/2023   K 4.2 08/20/2023   CL 104 08/20/2023   CREATININE 0.91 08/20/2023   BUN 17 08/20/2023   CO2 26 08/20/2023   TSH 2.61 08/20/2023   HGBA1C 5.9 08/20/2023    The 10-year ASCVD risk score (Arnett DK, et al., 2019) is: 13.6%   Values used to calculate the score:     Age: 33 years     Sex: Female     Is Non-Hispanic African American: No     Diabetic: No     Tobacco smoker: Yes     Systolic Blood Pressure: 136 mmHg     Is BP treated: Yes     HDL Cholesterol: 46.4 mg/dL     Total Cholesterol: 249 mg/dL      Assessment & Plan:   Physical  exam: Screening blood work  ordered Exercise  walking, started doing weights Weight  ok for age Substance abuse  none   Reviewed recommended immunizations.   Health Maintenance  Topic Date Due   Zoster Vaccines- Shingrix (1 of 2) Never done   Colonoscopy  10/18/2021   MAMMOGRAM  12/15/2022   INFLUENZA VACCINE  05/10/2023   Cervical Cancer Screening (HPV/Pap Cotest)  12/14/2024   DTaP/Tdap/Td (2 - Td or Tdap) 10/25/2025   Hepatitis C Screening  Completed   HIV Screening  Completed   HPV VACCINES  Aged Out   COVID-19 Vaccine  Discontinued      Due for colonoscopy-she plans on scheduling this.  Mammogram up-to-date-will get report    See Problem List for Assessment and Plan of chronic medical problems.

## 2023-08-23 NOTE — Patient Instructions (Addendum)

## 2023-08-24 ENCOUNTER — Ambulatory Visit (INDEPENDENT_AMBULATORY_CARE_PROVIDER_SITE_OTHER): Payer: BC Managed Care – PPO | Admitting: Internal Medicine

## 2023-08-24 ENCOUNTER — Encounter: Payer: Self-pay | Admitting: Internal Medicine

## 2023-08-24 VITALS — BP 136/72 | HR 82 | Temp 98.2°F | Ht 67.0 in | Wt 185.0 lb

## 2023-08-24 DIAGNOSIS — R3129 Other microscopic hematuria: Secondary | ICD-10-CM

## 2023-08-24 DIAGNOSIS — F17210 Nicotine dependence, cigarettes, uncomplicated: Secondary | ICD-10-CM

## 2023-08-24 DIAGNOSIS — R7303 Prediabetes: Secondary | ICD-10-CM | POA: Diagnosis not present

## 2023-08-24 DIAGNOSIS — Z0000 Encounter for general adult medical examination without abnormal findings: Secondary | ICD-10-CM | POA: Diagnosis not present

## 2023-08-24 DIAGNOSIS — E782 Mixed hyperlipidemia: Secondary | ICD-10-CM

## 2023-08-24 DIAGNOSIS — I10 Essential (primary) hypertension: Secondary | ICD-10-CM

## 2023-08-24 NOTE — Assessment & Plan Note (Addendum)
Chronic Blood pressure controlled CMP, cbc Continue valsartan 80 mg daily

## 2023-08-24 NOTE — Assessment & Plan Note (Signed)
Chronic Lab Results  Component Value Date   HGBA1C 5.9 08/20/2023   Check a1c Low sugar / carb diet Stressed regular exercise

## 2023-08-24 NOTE — Assessment & Plan Note (Signed)
W/u in past negative To see urology again

## 2023-08-24 NOTE — Assessment & Plan Note (Signed)
Chronic Had one cigarette yesterday and has not had one today Working on quitting Encouraged her to continue her efforts

## 2023-08-24 NOTE — Assessment & Plan Note (Addendum)
Chronic Regular exercise and healthy diet encouraged Check lipid panel CMP,  TSH Elevated ASCVD risk Recommended starting statin-she will think about this Discussed CT CAC

## 2024-07-08 ENCOUNTER — Other Ambulatory Visit: Payer: Self-pay | Admitting: Internal Medicine

## 2024-08-12 ENCOUNTER — Encounter: Payer: Self-pay | Admitting: Gastroenterology

## 2024-09-29 ENCOUNTER — Ambulatory Visit: Payer: Self-pay | Admitting: Gastroenterology

## 2024-09-29 ENCOUNTER — Other Ambulatory Visit

## 2024-09-29 ENCOUNTER — Encounter: Payer: Self-pay | Admitting: Gastroenterology

## 2024-09-29 VITALS — BP 160/80 | HR 98 | Ht 67.0 in | Wt 180.0 lb

## 2024-09-29 DIAGNOSIS — Z860101 Personal history of adenomatous and serrated colon polyps: Secondary | ICD-10-CM | POA: Diagnosis not present

## 2024-09-29 DIAGNOSIS — Z860100 Personal history of colon polyps, unspecified: Secondary | ICD-10-CM

## 2024-09-29 DIAGNOSIS — I10 Essential (primary) hypertension: Secondary | ICD-10-CM | POA: Diagnosis not present

## 2024-09-29 DIAGNOSIS — F17210 Nicotine dependence, cigarettes, uncomplicated: Secondary | ICD-10-CM | POA: Diagnosis not present

## 2024-09-29 DIAGNOSIS — K8689 Other specified diseases of pancreas: Secondary | ICD-10-CM

## 2024-09-29 DIAGNOSIS — R9389 Abnormal findings on diagnostic imaging of other specified body structures: Secondary | ICD-10-CM | POA: Diagnosis not present

## 2024-09-29 DIAGNOSIS — R3129 Other microscopic hematuria: Secondary | ICD-10-CM

## 2024-09-29 LAB — COMPREHENSIVE METABOLIC PANEL WITH GFR
ALT: 12 U/L (ref 3–35)
AST: 16 U/L (ref 5–37)
Albumin: 4.4 g/dL (ref 3.5–5.2)
Alkaline Phosphatase: 70 U/L (ref 39–117)
BUN: 17 mg/dL (ref 6–23)
CO2: 29 meq/L (ref 19–32)
Calcium: 9.8 mg/dL (ref 8.4–10.5)
Chloride: 102 meq/L (ref 96–112)
Creatinine, Ser: 0.81 mg/dL (ref 0.40–1.20)
GFR: 78.01 mL/min
Glucose, Bld: 93 mg/dL (ref 70–99)
Potassium: 4.2 meq/L (ref 3.5–5.1)
Sodium: 138 meq/L (ref 135–145)
Total Bilirubin: 0.4 mg/dL (ref 0.2–1.2)
Total Protein: 6.8 g/dL (ref 6.0–8.3)

## 2024-09-29 LAB — CBC WITH DIFFERENTIAL/PLATELET
Basophils Absolute: 0 K/uL (ref 0.0–0.1)
Basophils Relative: 0.5 % (ref 0.0–3.0)
Eosinophils Absolute: 0.2 K/uL (ref 0.0–0.7)
Eosinophils Relative: 1.7 % (ref 0.0–5.0)
HCT: 40.1 % (ref 36.0–46.0)
Hemoglobin: 13.5 g/dL (ref 12.0–15.0)
Lymphocytes Relative: 27.3 % (ref 12.0–46.0)
Lymphs Abs: 2.4 K/uL (ref 0.7–4.0)
MCHC: 33.7 g/dL (ref 30.0–36.0)
MCV: 88.8 fl (ref 78.0–100.0)
Monocytes Absolute: 0.6 K/uL (ref 0.1–1.0)
Monocytes Relative: 6.8 % (ref 3.0–12.0)
Neutro Abs: 5.7 K/uL (ref 1.4–7.7)
Neutrophils Relative %: 63.7 % (ref 43.0–77.0)
Platelets: 305 K/uL (ref 150.0–400.0)
RBC: 4.51 Mil/uL (ref 3.87–5.11)
RDW: 12.9 % (ref 11.5–15.5)
WBC: 9 K/uL (ref 4.0–10.5)

## 2024-09-29 LAB — PROTIME-INR
INR: 1.1 ratio — ABNORMAL HIGH (ref 0.8–1.0)
Prothrombin Time: 11.2 s (ref 9.6–13.1)

## 2024-09-29 MED ORDER — NA SULFATE-K SULFATE-MG SULF 17.5-3.13-1.6 GM/177ML PO SOLN: 1.0000 | Freq: Once | ORAL | 0 refills | Status: AC

## 2024-09-29 NOTE — Patient Instructions (Signed)
 Your provider has requested that you go to the basement level for lab work before leaving today. Press B on the elevator. The lab is located at the first door on the left as you exit the elevator.  Due to recent changes in healthcare laws, you may see the results of your imaging and laboratory studies on MyChart before your provider has had a chance to review them.  We understand that in some cases there may be results that are confusing or concerning to you. Not all laboratory results come back in the same time frame and the provider may be waiting for multiple results in order to interpret others.  Please give us  48 hours in order for your provider to thoroughly review all the results before contacting the office for clarification of your results.   You have been scheduled for an abdominal ultrasound at [facility] (1st floor of hospital) on Monday, 10/06/25 at 8:00 am. Please arrive 30 minutes prior to your appointment for registration. Make certain not to have anything to eat or drink after midnight prior to your appointment. Should you need to reschedule your appointment, please contact radiology at [phone]. This test typically takes about 30 minutes to perform.  You have been scheduled for a colonoscopy. Please follow written instructions given to you at your visit today.   If you use inhalers (even only as needed), please bring them with you on the day of your procedure.  DO NOT TAKE 7 DAYS PRIOR TO TEST- Trulicity (dulaglutide) Ozempic, Wegovy (semaglutide) Mounjaro, Zepbound (tirzepatide) Bydureon Bcise (exanatide extended release)  DO NOT TAKE 1 DAY PRIOR TO YOUR TEST Rybelsus (semaglutide) Adlyxin (lixisenatide) Victoza (liraglutide) Byetta (exanatide) ___________________________________________________________________________   Thank you for trusting me with your gastrointestinal care!   Nestor Blower,  PA  _______________________________________________________  If your blood pressure at your visit was 140/90 or greater, please contact your primary care physician to follow up on this.  _______________________________________________________  If you are age 50 or older, your body mass index should be between 23-30. Your Body mass index is 28.19 kg/m. If this is out of the aforementioned range listed, please consider follow up with your Primary Care Provider.  If you are age 75 or younger, your body mass index should be between 19-25. Your Body mass index is 28.19 kg/m. If this is out of the aformentioned range listed, please consider follow up with your Primary Care Provider.   ________________________________________________________  The [facility] providers would like to encourage you to use MYCHART to communicate with providers for non-urgent requests or questions.  Due to long hold times on the telephone, sending your provider a message by [facility] may be a faster and more efficient way to get a response.  Please allow 48 business hours for a response.  Please remember that this is for non-urgent requests.  _______________________________________________________  [facility] is using a team-based approach to care.  Your team is made up of your doctor and two to three APPS. Our APPS (Nurse Practitioners and Physician Assistants) work with your physician to ensure care continuity for you. They are fully qualified to address your health concerns and develop a treatment plan. They communicate directly with your gastroenterologist to care for you. Seeing the Advanced Practice Practitioners on your physician's team can help you by facilitating care more promptly, often allowing for earlier appointments, access to diagnostic testing, procedures, and other specialty referrals.

## 2024-09-29 NOTE — Progress Notes (Signed)
 "  Chief Complaint:abnormal imaging Primary GI MD: Dr. Abran  HPI: 63 year old female with medical history as listed below presents for evaluation of abnormal imaging referred here by urology.  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  History of Present Illness  Deanna Woods is a 63 year old female with a history of colonic polyps who presents for evaluation of possible chronic liver disease after CT imaging showed mildly irregular liver margins and cholelithiasis.  Referred after a urology workup for hematuria revealed mildly irregular liver margins, a gallstone, and a stable 7 mm pancreatic lesion on CT imaging. No gastrointestinal symptoms, including abdominal pain, nausea, vomiting, or changes in appetite. Maintains good oral intake.  No prior diagnosis or evaluation for fatty liver disease. Liver function tests have consistently been normal, including most recent labs from November 2024. No family history of liver disease. Currently abstains from alcohol, with prior history of social drinking on weekends. No symptoms suggestive of liver dysfunction.  Overdue for surveillance colonoscopy after removal of two precancerous colonic polyps in 2018; follow-up delayed due to insurance concerns. No gastrointestinal bleeding, changes in bowel habits, or other colorectal symptoms.  Reports elevated blood pressure readings in clinic, with normal home blood pressure readings.   PREVIOUS GI WORKUP   Colonoscopy 10/2016 - Two 5 to 6 mm polyps (sessile serrated polyps)  in the ascending colon, removed with a cold snare. Resected and retrieved.  - Diverticulosis in the sigmoid colon. Incidental lipoma right colon.  - Internal hemorrhoids.  - The examination was otherwise normal on direct and retroflexion views.  Past Medical History:  Diagnosis Date   Heart murmur    Hypertension     Past Surgical History:  Procedure Laterality Date   TUBAL LIGATION      Current Outpatient Medications  Medication Sig Dispense Refill   ascorbic acid (VITAMIN C) 500 MG tablet Take 1,000 mg by mouth daily.     Biotin 1 MG CAPS Biotin     Calcium  Carbonate-Vit D-Min (CALCIUM  600+D3 PLUS MINERALS) 600-800 MG-UNIT CHEW Chew 1 each by mouth daily. 60 tablet    Na Sulfate-K Sulfate-Mg Sulfate concentrate (SUPREP) 17.5-3.13-1.6 GM/177ML SOLN Take 1 kit (354 mLs total) by mouth once for 1 dose. 354 mL 0   nicotine  (NICOTROL ) 10 MG inhaler Inhale 1 Cartridge (1 continuous puffing total) into the lungs as needed for smoking cessation. 168 each 2   Turmeric (QC TUMERIC COMPLEX) 500 MG CAPS      valsartan  (DIOVAN ) 80 MG tablet TAKE 1 TABLET BY MOUTH EVERY DAY 90 tablet 3   No current facility-administered medications for this visit.    Allergies as of 09/29/2024 - Review Complete 09/29/2024  Allergen Reaction Noted   Thimerosal (thiomersal) Itching 10/04/2016   Bupropion  Other (See Comments) 08/23/2023    Family History  Problem Relation Age of Onset   Hypertension Mother    Stroke Mother    Kidney disease Mother    Osteoporosis Mother    Stroke Maternal Grandmother    Colon cancer Neg Hx     Social History  Socioeconomic History   Marital status: Married    Spouse name: Not on file   Number of children: Not on file   Years of education: Not on file   Highest education level: Bachelor's degree (e.g., BA, AB, BS)  Occupational History   Not on file  Tobacco Use   Smoking status: Every Day    Current packs/day: 0.50    Average packs/day: 0.5 packs/day for  25.0 years (12.5 ttl pk-yrs)    Types: Cigarettes   Smokeless tobacco: Never  Substance and Sexual Activity   Alcohol use: Yes    Comment: weekends   Drug use: No   Sexual activity: Not on file  Other Topics Concern   Not on file  Social History Narrative   Very active, not regular exercise   Social Drivers of Health   Tobacco Use: High Risk (09/29/2024)   Patient History    Smoking Tobacco Use: Every Day    Smokeless Tobacco Use: Never    Passive Exposure: Not on file  Financial Resource Strain: Low Risk (08/23/2023)   Overall Financial Resource Strain (CARDIA)    Difficulty of Paying Living Expenses: Not hard at all  Food Insecurity: No Food Insecurity (08/23/2023)   Hunger Vital Sign    Worried About Running Out of Food in the Last Year: Never true    Ran Out of Food in the Last Year: Never true  Transportation Needs: No Transportation Needs (08/23/2023)   PRAPARE - Administrator, Civil Service (Medical): No    Lack of Transportation (Non-Medical): No  Physical Activity: Insufficiently Active (08/23/2023)   Exercise Vital Sign    Days of Exercise per Week: 5 days    Minutes of Exercise per Session: 20 min  Stress: No Stress Concern Present (08/23/2023)   [employer] of Occupational Health - Occupational Stress Questionnaire    Feeling of Stress : Only a little  Social Connections: Moderately Integrated (08/23/2023)   Social Connection and Isolation Panel    Frequency of Communication with Friends and Family: Once a week    Frequency of Social Gatherings with Friends and Family: Once a week    Attends Religious Services: More than 4 times per year    Active Member of [employer] or Organizations: Yes    Attends Banker Meetings: More than 4 times per year    Marital Status: Married  Catering Manager Violence: Not on file  Depression (PHQ2-9): Low Risk (08/03/2022)   Depression (PHQ2-9)    PHQ-2 Score: 0  Alcohol Screen: Low Risk  (08/23/2023)   Alcohol Screen    Last Alcohol Screening Score (AUDIT): 4  Housing: Low Risk (08/23/2023)   Housing    Last Housing Risk Score: 0  Utilities: Not on file  Health Literacy: Not on file    Review of Systems:    Constitutional: No weight loss, fever, chills, weakness or fatigue HEENT: Eyes: No change in vision               Ears, Nose, Throat:  No change in hearing or congestion Skin: No rash or itching Cardiovascular: No chest pain, chest pressure or palpitations   Respiratory: No SOB or cough Gastrointestinal: See HPI and otherwise negative Genitourinary: No dysuria or change in urinary frequency Neurological: No headache, dizziness or syncope Musculoskeletal: No new muscle or joint pain Hematologic: No bleeding or bruising Psychiatric: No history of depression or anxiety    Physical Exam:  Vital signs: BP (!) 160/80 Comment:  Pt. states she gets high BP at the dr.  Pulse 98   Ht 5' 7 (1.702 m)   Wt 180 lb (81.6 kg)   SpO2 99%   BMI 28.19 kg/m   Constitutional: NAD, alert and cooperative Head:  Normocephalic and atraumatic. Eyes:   PEERL, EOMI. No icterus. Conjunctiva pink. Respiratory: Respirations even and unlabored. Lungs clear to auscultation bilaterally.   No wheezes, crackles, or rhonchi.  Cardiovascular:  Regular rate and rhythm. No peripheral edema, cyanosis or pallor.  Gastrointestinal:  Soft, nondistended, nontender. No rebound or guarding. Normal bowel sounds. No appreciable masses or hepatomegaly. Rectal:  Declines Msk:  Symmetrical without gross deformities. Without edema, no deformity or joint abnormality.  Neurologic:  Alert and  oriented x4;  grossly normal neurologically.  Skin:   Dry and intact without significant lesions or rashes. Psychiatric: Oriented to person, place and time. Demonstrates good judgement and reason without abnormal affect or behaviors.  Physical Exam    RELEVANT LABS AND IMAGING: CBC    Component Value  Date/Time   WBC 9.0 09/29/2024 0934   RBC 4.51 09/29/2024 0934   HGB 13.5 09/29/2024 0934   HCT 40.1 09/29/2024 0934   PLT 305.0 09/29/2024 0934   MCV 88.8 09/29/2024 0934   MCHC 33.7 09/29/2024 0934   RDW 12.9 09/29/2024 0934   LYMPHSABS 2.4 09/29/2024 0934   MONOABS 0.6 09/29/2024 0934   EOSABS 0.2 09/29/2024 0934   BASOSABS 0.0 09/29/2024 0934    CMP     Component Value Date/Time   NA 138 09/29/2024 0934   K 4.2 09/29/2024 0934   CL 102 09/29/2024 0934   CO2 29 09/29/2024 0934   GLUCOSE 93 09/29/2024 0934   BUN 17 09/29/2024 0934   CREATININE 0.81 09/29/2024 0934   CALCIUM  9.8 09/29/2024 0934   PROT 6.8 09/29/2024 0934   ALBUMIN 4.4 09/29/2024 0934   AST 16 09/29/2024 0934   ALT 12 09/29/2024 0934   ALKPHOS 70 09/29/2024 0934   BILITOT 0.4 09/29/2024 0934     Assessment/Plan:   Abnormal imaging CT hematuria protocol with possible cirrhosis per urology but CT scan patient brought in notes liver irregularity without further description.  Labs 08/2023 normal. - CBC, CMP, PT/INR - RUQ ultrasound with elastography  Pancreatic lesion Pancreatic lesion, likely benign, 7 mm stable over the last 4 years with repeat imaging recommended in 1 year  History of colon polyps Colonoscopy 10/2016 with 2 SSPs and recall 5 years - Schedule colonoscopy - I thoroughly discussed the procedure with the patient (at bedside) to include nature of the procedure, alternatives, benefits, and risks (including but not limited to bleeding, infection, perforation, anesthesia/cardiac pulmonary complications).  Patient verbalized understanding and gave verbal consent to proceed with procedure.   Hypertension Notes whitecoat hypertension, her blood pressure today is 160/80.  She takes her blood pressure at home and it is typically 130/80  Nestor Mollie RIGGERS [facility] 09/29/2024, 11:42 AM  Cc: Geofm Glade PARAS, MD "

## 2024-09-30 ENCOUNTER — Ambulatory Visit: Payer: Self-pay | Admitting: Gastroenterology

## 2024-10-01 NOTE — Progress Notes (Signed)
 Noted

## 2024-10-08 ENCOUNTER — Ambulatory Visit (HOSPITAL_COMMUNITY)
Admission: RE | Admit: 2024-10-08 | Discharge: 2024-10-08 | Disposition: A | Discharge status: Home / Self Care | Source: Ambulatory Visit | Attending: Gastroenterology | Admitting: Gastroenterology

## 2024-10-08 DIAGNOSIS — R9389 Abnormal findings on diagnostic imaging of other specified body structures: Secondary | ICD-10-CM | POA: Diagnosis present

## 2024-10-08 DIAGNOSIS — Z860100 Personal history of colon polyps, unspecified: Secondary | ICD-10-CM | POA: Insufficient documentation

## 2024-10-15 ENCOUNTER — Encounter: Payer: Self-pay | Admitting: Internal Medicine

## 2024-10-22 ENCOUNTER — Ambulatory Visit: Admitting: Internal Medicine

## 2024-10-22 ENCOUNTER — Encounter: Payer: Self-pay | Admitting: Internal Medicine

## 2024-10-22 VITALS — BP 121/69 | HR 76 | Temp 97.2°F | Resp 17 | Ht 67.0 in | Wt 180.0 lb

## 2024-10-22 DIAGNOSIS — D123 Benign neoplasm of transverse colon: Secondary | ICD-10-CM

## 2024-10-22 DIAGNOSIS — R9389 Abnormal findings on diagnostic imaging of other specified body structures: Secondary | ICD-10-CM

## 2024-10-22 DIAGNOSIS — Z860100 Personal history of colon polyps, unspecified: Secondary | ICD-10-CM

## 2024-10-22 DIAGNOSIS — K5730 Diverticulosis of large intestine without perforation or abscess without bleeding: Secondary | ICD-10-CM | POA: Diagnosis not present

## 2024-10-22 DIAGNOSIS — Z1211 Encounter for screening for malignant neoplasm of colon: Secondary | ICD-10-CM

## 2024-10-22 DIAGNOSIS — K648 Other hemorrhoids: Secondary | ICD-10-CM | POA: Diagnosis not present

## 2024-10-22 DIAGNOSIS — Z860101 Personal history of adenomatous and serrated colon polyps: Secondary | ICD-10-CM

## 2024-10-22 DIAGNOSIS — K635 Polyp of colon: Secondary | ICD-10-CM | POA: Diagnosis not present

## 2024-10-22 MED ORDER — SODIUM CHLORIDE 0.9 % IV SOLN: 500.0000 mL | Freq: Once | INTRAVENOUS | Status: DC

## 2024-10-22 NOTE — Patient Instructions (Signed)
Handouts given on polyps, hemorrhoids and diverticulosis.  YOU HAD AN ENDOSCOPIC PROCEDURE TODAY AT THE [facility]:   Refer to the procedure report that was given to you for any specific questions about what was found during the examination.  If the procedure report does not answer your questions, please call your gastroenterologist to clarify.  If you requested that your care partner not be given the details of your procedure findings, then the procedure report has been included in a sealed envelope for you to review at your convenience later.  YOU SHOULD EXPECT: Some feelings of bloating in the abdomen. Passage of more gas than usual.  Walking can help get rid of the air that was put into your GI tract during the procedure and reduce the bloating. If you had a lower endoscopy (such as a colonoscopy or flexible sigmoidoscopy) you may notice spotting of blood in your stool or on the toilet paper. If you underwent a bowel prep for your procedure, you may not have a normal bowel movement for a few days.  Please Note:  You might notice some irritation and congestion in your nose or some drainage.  This is from the oxygen used during your procedure.  There is no need for concern and it should clear up in a day or so.  SYMPTOMS TO REPORT IMMEDIATELY:  Following lower endoscopy (colonoscopy or flexible sigmoidoscopy):  Excessive amounts of blood in the stool  Significant tenderness or worsening of abdominal pains  Swelling of the abdomen that is new, acute  Fever of 100F or higher  For urgent or emergent issues, a gastroenterologist can be reached at any hour by calling [phone]. Do not use MyChart messaging for urgent concerns.    DIET:  We do recommend a small meal at first, but then you may proceed to your regular diet.  Drink plenty of fluids but you should avoid alcoholic beverages for 24 hours.  ACTIVITY:  You should plan to take it easy for the rest of today and you should  NOT DRIVE or use heavy machinery until tomorrow (because of the sedation medicines used during the test).    FOLLOW UP: Our staff will call the number listed on your records the next business day following your procedure.  We will call around 7:15- 8:00 am to check on you and address any questions or concerns that you may have regarding the information given to you following your procedure. If we do not reach you, we will leave a message.     If any biopsies were taken you will be contacted by phone or by letter within the next 1-3 weeks.  Please call us at [phone] if you have not heard about the biopsies in 3 weeks.    SIGNATURES/CONFIDENTIALITY: You and/or your care partner have signed paperwork which will be entered into your electronic medical record.  These signatures attest to the fact that that the information above on your After Visit Summary has been reviewed and is understood.  Full responsibility of the confidentiality of this discharge information lies with you and/or your care-partner. 

## 2024-10-22 NOTE — Progress Notes (Signed)
 Expand All Collapse All    Chief Complaint:abnormal imaging Primary GI MD: Dr. Abran   HPI: 64 year old female with medical history as listed below presents for evaluation of abnormal imaging referred here by urology.   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   History of Present Illness   Deanna Woods is a 64 year old female with a history of colonic polyps who presents for evaluation of possible chronic liver disease after CT imaging showed mildly irregular liver margins and cholelithiasis.   Referred after a urology workup for hematuria revealed mildly irregular liver margins, a gallstone, and a stable 7 mm pancreatic lesion on CT imaging. No gastrointestinal symptoms, including abdominal pain, nausea, vomiting, or changes in appetite. Maintains good oral intake.   No prior diagnosis or evaluation for fatty liver disease. Liver function tests have consistently been normal, including most recent labs from November 2024. No family history of liver disease. Currently abstains from alcohol, with prior history of social drinking on weekends. No symptoms suggestive of liver dysfunction.   Overdue for surveillance colonoscopy after removal of two precancerous colonic polyps in 2018; follow-up delayed due to insurance concerns. No gastrointestinal bleeding, changes in bowel habits, or other colorectal symptoms.   Reports elevated blood pressure readings in clinic, with normal home blood pressure readings.     PREVIOUS GI WORKUP    Colonoscopy 10/2016 - Two 5 to  6 mm polyps (sessile serrated polyps) in the ascending colon, removed with a cold snare. Resected and retrieved.  - Diverticulosis in the sigmoid colon. Incidental lipoma right colon.  - Internal hemorrhoids.  - The examination was otherwise normal on direct and retroflexion views.       Past Medical History:  Diagnosis Date   Heart murmur     Hypertension                 Past Surgical History:  Procedure Laterality Date   TUBAL LIGATION                    Current Outpatient Medications  Medication Sig Dispense Refill   ascorbic acid (VITAMIN C) 500 MG tablet Take 1,000 mg by mouth daily.       Biotin 1 MG CAPS Biotin       Calcium  Carbonate-Vit D-Min (CALCIUM  600+D3 PLUS MINERALS) 600-800 MG-UNIT CHEW Chew 1 each by mouth daily. 60 tablet     Na Sulfate-K Sulfate-Mg Sulfate concentrate (SUPREP) 17.5-3.13-1.6 GM/177ML SOLN Take 1 kit (354 mLs total) by mouth once for 1 dose. 354 mL 0   nicotine  (NICOTROL ) 10 MG inhaler Inhale 1 Cartridge (1 continuous puffing total) into the lungs as needed for smoking cessation. 168 each 2   Turmeric (QC TUMERIC COMPLEX) 500 MG CAPS         valsartan  (DIOVAN ) 80 MG tablet TAKE 1 TABLET BY MOUTH EVERY DAY 90 tablet 3      No current facility-administered medications for this visit.             Allergies as of 09/29/2024 - Review  Complete 09/29/2024  Allergen Reaction Noted   Thimerosal (thiomersal) Itching 10/04/2016   Bupropion  Other (See Comments) 08/23/2023           Family History  Problem Relation Age of Onset   Hypertension Mother     Stroke Mother     Kidney disease Mother     Osteoporosis Mother     Stroke Maternal Grandmother     Colon cancer Neg Hx            Social History         Socioeconomic History   Marital status: Married      Spouse name: Not on file   Number of children: Not on file   Years of education: Not on file   Highest education level: Bachelor's degree (e.g., BA, AB, BS)  Occupational  History   Not on file  Tobacco Use   Smoking status: Every Day      Current packs/day: 0.50      Average packs/day: 0.5 packs/day for 25.0 years (12.5 ttl pk-yrs)      Types: Cigarettes   Smokeless tobacco: Never  Substance and Sexual Activity   Alcohol use: Yes      Comment: weekends   Drug use: No   Sexual activity: Not on file  Other Topics Concern   Not on file  Social History Narrative    Very active, not regular exercise    Social Drivers of Health        Tobacco Use: High Risk (09/29/2024)    Patient History     Smoking Tobacco Use: Every Day     Smokeless Tobacco Use: Never     Passive Exposure: Not on file  Financial Resource Strain: Low Risk (08/23/2023)    Overall Financial Resource Strain (CARDIA)     Difficulty of Paying Living Expenses: Not hard at all  Food Insecurity: No Food Insecurity (08/23/2023)    Hunger Vital Sign     Worried About Running Out of Food in the Last Year: Never true     Ran Out of Food in the Last Year: Never true  Transportation Needs: No Transportation Needs (08/23/2023)    PRAPARE - Therapist, Art (Medical): No     Lack of Transportation (Non-Medical): No  Physical Activity: Insufficiently Active (08/23/2023)    Exercise Vital Sign     Days of Exercise per Week: 5 days     Minutes of Exercise per Session: 20 min  Stress: No Stress Concern Present (08/23/2023)    [employer] of Occupational Health - Occupational Stress Questionnaire     Feeling of Stress : Only a little  Social Connections: Moderately Integrated (08/23/2023)    Social Connection and Isolation Panel     Frequency of Communication with Friends and Family: Once a week     Frequency of Social Gatherings with Friends and Family: Once a week     Attends Religious Services: More than 4 times per year     Active Member of Clubs or Organizations: Yes     Attends Banker Meetings: More than 4 times per year     Marital Status:  Married  Catering Manager Violence: Not on file  Depression (PHQ2-9): Low Risk (08/03/2022)    Depression (PHQ2-9)     PHQ-2 Score: 0  Alcohol Screen: Low Risk (08/23/2023)    Alcohol Screen     Last Alcohol Screening Score (AUDIT): 4  Housing: Low Risk (08/23/2023)  Housing     Last Housing Risk Score: 0  Utilities: Not on file  Health Literacy: Not on file      Review of Systems:    Constitutional: No weight loss, fever, chills, weakness or fatigue HEENT: Eyes: No change in vision               Ears, Nose, Throat:  No change in hearing or congestion Skin: No rash or itching Cardiovascular: No chest pain, chest pressure or palpitations   Respiratory: No SOB or cough Gastrointestinal: See HPI and otherwise negative Genitourinary: No dysuria or change in urinary frequency Neurological: No headache, dizziness or syncope Musculoskeletal: No new muscle or joint pain Hematologic: No bleeding or bruising Psychiatric: No history of depression or anxiety      Physical Exam:  Vital signs: BP (!) 160/80 Comment: Pt. states she gets high BP at the dr.  Pulse 98   Ht 5' 7 (1.702 m)   Wt 180 lb (81.6 kg)   SpO2 99%   BMI 28.19 kg/m    Constitutional: NAD, alert and cooperative Head:  Normocephalic and atraumatic. Eyes:   PEERL, EOMI. No icterus. Conjunctiva pink. Respiratory: Respirations even and unlabored. Lungs clear to auscultation bilaterally.   No wheezes, crackles, or rhonchi.  Cardiovascular:  Regular rate and rhythm. No peripheral edema, cyanosis or pallor.  Gastrointestinal:  Soft, nondistended, nontender. No rebound or guarding. Normal bowel sounds. No appreciable masses or hepatomegaly. Rectal:  Declines Msk:  Symmetrical without gross deformities. Without edema, no deformity or joint abnormality.  Neurologic:  Alert and  oriented x4;  grossly normal neurologically.  Skin:   Dry and intact without significant lesions or rashes. Psychiatric: Oriented to person,  place and time. Demonstrates good judgement and reason without abnormal affect or behaviors.   Physical Exam       RELEVANT LABS AND IMAGING: CBC Labs (Brief)          Component Value Date/Time    WBC 9.0 09/29/2024 0934    RBC 4.51 09/29/2024 0934    HGB 13.5 09/29/2024 0934    HCT 40.1 09/29/2024 0934    PLT 305.0 09/29/2024 0934    MCV 88.8 09/29/2024 0934    MCHC 33.7 09/29/2024 0934    RDW 12.9 09/29/2024 0934    LYMPHSABS 2.4 09/29/2024 0934    MONOABS 0.6 09/29/2024 0934    EOSABS 0.2 09/29/2024 0934    BASOSABS 0.0 09/29/2024 0934        CMP     Labs (Brief)          Component Value Date/Time    NA 138 09/29/2024 0934    K 4.2 09/29/2024 0934    CL 102 09/29/2024 0934    CO2 29 09/29/2024 0934    GLUCOSE 93 09/29/2024 0934    BUN 17 09/29/2024 0934    CREATININE 0.81 09/29/2024 0934    CALCIUM  9.8 09/29/2024 0934    PROT 6.8 09/29/2024 0934    ALBUMIN 4.4 09/29/2024 0934    AST 16 09/29/2024 0934    ALT 12 09/29/2024 0934    ALKPHOS 70 09/29/2024 0934    BILITOT 0.4 09/29/2024 0934          Assessment/Plan:    Abnormal imaging CT hematuria protocol with possible cirrhosis per urology but CT scan patient brought in notes liver irregularity without further description.  Labs 08/2023 normal. - CBC, CMP, PT/INR - RUQ ultrasound with elastography   Pancreatic lesion Pancreatic lesion, likely benign,  7 mm stable over the last 4 years with repeat imaging recommended in 1 year   History of colon polyps Colonoscopy 10/2016 with 2 SSPs and recall 5 years - Schedule colonoscopy - I thoroughly discussed the procedure with the patient (at bedside) to include nature of the procedure, alternatives, benefits, and risks (including but not limited to bleeding, infection, perforation, anesthesia/cardiac pulmonary complications).  Patient verbalized understanding and gave verbal consent to proceed with procedure.    Hypertension Notes whitecoat hypertension, her  blood pressure today is 160/80.  She takes her blood pressure at home and it is typically 130/80   Nestor Mollie RIGGERS [facility] 09/29/2024, 11:42 AM   Cc: Geofm Glade PARAS, MD

## 2024-10-22 NOTE — Progress Notes (Signed)
Updated medical record with pt

## 2024-10-22 NOTE — Op Note (Signed)
 [facility] Patient Name: Deanna Woods Procedure Date: 10/22/2024 2:57 PM MRN: [id number] Endoscopist: Norleen SAILOR. Abran , MD, [id number] Age: 64 Referring MD:  Date of Birth: [birth year] Gender: Female Account #: [id number] Procedure:                Colonoscopy with cold snare polypectomy x 1 Indications:              High risk colon cancer surveillance: Personal                            history of sessile serrated colon polyp (less than                            10 mm in size) with no dysplasia (2018) Medicines:                Monitored Anesthesia Care Procedure:                Pre-Anesthesia Assessment:                           - Prior to the procedure, a History and Physical                            was performed, and patient medications and                            allergies were reviewed. The patient's tolerance of                            previous anesthesia was also reviewed. The risks                            and benefits of the procedure and the sedation                            options and risks were discussed with the patient.                            All questions were answered, and informed consent                            was obtained. Prior Anticoagulants: The patient has                            taken no anticoagulant or antiplatelet agents. ASA                            Grade Assessment: II - A patient with mild systemic                            disease. After reviewing the risks and benefits,                            the patient was deemed in satisfactory condition to  undergo the procedure.                           After obtaining informed consent, the colonoscope                            was passed under direct vision. Throughout the                            procedure, the patient's blood pressure, pulse, and                            oxygen saturations were monitored continuously. The                             CF [id number] #[id number] was introduced through the anus                            and advanced to the the cecum, identified by                            appendiceal orifice and ileocecal valve. The                            ileocecal valve, appendiceal orifice, and rectum                            were photographed. The quality of the bowel                            preparation was excellent. The colonoscopy was                            performed without difficulty. The patient tolerated                            the procedure well. The bowel preparation used was                            SUPREP via split dose instruction. Scope In: 3:09:10 PM Scope Out: 3:23:17 PM Scope Withdrawal Time: 0 hours 10 minutes 25 seconds  Total Procedure Duration: 0 hours 14 minutes 7 seconds  Findings:                 A 5 mm polyp was found in the transverse colon. The                            polyp was sessile. The polyp was removed with a                            cold snare. Resection and retrieval were complete.                           Multiple diverticula were found in the sigmoid  colon.                           Internal hemorrhoids were found during retroflexion.                           The exam was otherwise without abnormality on                            direct and retroflexion views. Complications:            No immediate complications. Estimated blood loss:                            None. Estimated Blood Loss:     Estimated blood loss: none. Impression:               - One 5 mm polyp in the transverse colon, removed                            with a cold snare. Resected and retrieved.                           - Diverticulosis in the sigmoid colon.                           - Internal hemorrhoids.                           - The examination was otherwise normal on direct                            and retroflexion views. Recommendation:            - Repeat colonoscopy in 10 years for surveillance.                           - Patient has a contact number available for                            emergencies. The signs and symptoms of potential                            delayed complications were discussed with the                            patient. Return to normal activities tomorrow.                            Written discharge instructions were provided to the                            patient.                           - Resume previous diet.                           -  Continue present medications.                           - Await pathology results. Norleen SAILOR. Abran, MD 10/22/2024 3:28:04 PM This report has been signed electronically.

## 2024-10-22 NOTE — Progress Notes (Signed)
 Called to room to assist during endoscopic procedure.  Patient ID and intended procedure confirmed with present staff. Received instructions for my participation in the procedure from the performing physician.

## 2024-10-22 NOTE — Progress Notes (Signed)
 Vss nad trans to pacu

## 2024-10-23 ENCOUNTER — Telehealth: Payer: Self-pay | Admitting: *Deleted

## 2024-10-23 NOTE — Telephone Encounter (Signed)
" °  Follow up Call-     10/22/2024    2:00 PM  Call back number  Post procedure Call Back phone  # [phone]  Permission to leave phone message Yes     Patient questions:  Do you have a fever, pain , or abdominal swelling? No. Pain Score  0 *  Have you tolerated food without any problems? Yes.    Have you been able to return to your normal activities? Yes.    Do you have any questions about your discharge instructions: Diet   No. Medications  No. Follow up visit  No.  Do you have questions or concerns about your Care? No.  Actions: * If pain score is 4 or above: No action needed, pain <4.   "

## 2024-10-28 ENCOUNTER — Ambulatory Visit: Payer: Self-pay | Admitting: Internal Medicine

## 2024-10-28 LAB — SURGICAL PATHOLOGY
